# Patient Record
Sex: Male | Born: 1980 | Hispanic: Yes | Marital: Single | Smoking: Former smoker
Health system: Southern US, Community
[De-identification: ages and names within clinical notes are randomized; demographics above are authoritative.]

## PROBLEM LIST (undated history)

## (undated) DIAGNOSIS — J45909 Unspecified asthma, uncomplicated: Secondary | ICD-10-CM

---

## 2021-01-15 ENCOUNTER — Other Ambulatory Visit: Payer: Self-pay

## 2021-01-15 ENCOUNTER — Ambulatory Visit (INDEPENDENT_AMBULATORY_CARE_PROVIDER_SITE_OTHER): Payer: Self-pay

## 2021-01-15 ENCOUNTER — Ambulatory Visit (HOSPITAL_COMMUNITY)
Admission: EM | Admit: 2021-01-15 | Discharge: 2021-01-15 | Disposition: A | Discharge status: Home / Self Care | Payer: Self-pay | Attending: Emergency Medicine | Admitting: Emergency Medicine

## 2021-01-15 ENCOUNTER — Encounter (HOSPITAL_COMMUNITY): Payer: Self-pay | Admitting: *Deleted

## 2021-01-15 DIAGNOSIS — S39012A Strain of muscle, fascia and tendon of lower back, initial encounter: Secondary | ICD-10-CM

## 2021-01-15 DIAGNOSIS — M545 Low back pain, unspecified: Secondary | ICD-10-CM

## 2021-01-15 DIAGNOSIS — T148XXA Other injury of unspecified body region, initial encounter: Secondary | ICD-10-CM

## 2021-01-15 DIAGNOSIS — M549 Dorsalgia, unspecified: Secondary | ICD-10-CM

## 2021-01-15 DIAGNOSIS — M25512 Pain in left shoulder: Secondary | ICD-10-CM

## 2021-01-15 MED ORDER — METHOCARBAMOL 500 MG PO TABS: 500.0000 mg | ORAL_TABLET | Freq: Two times a day (BID) | ORAL | 0 refills | Status: DC

## 2021-01-15 MED ORDER — NAPROXEN 500 MG PO TABS: 500.0000 mg | ORAL_TABLET | Freq: Two times a day (BID) | ORAL | 0 refills | Status: DC
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
## 2021-01-15 NOTE — Discharge Instructions (Addendum)
Pt will need to follow up at ortho for further tests  Take muscle relaxor as needed for pain, do not drive while taking  Take nsaids as needed for pain  X rays are normal today
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
## 2021-07-13 ENCOUNTER — Encounter (HOSPITAL_COMMUNITY): Payer: Self-pay

## 2021-07-13 ENCOUNTER — Inpatient Hospital Stay (HOSPITAL_COMMUNITY)
Admission: EM | Admit: 2021-07-13 | Discharge: 2021-07-15 | DRG: 202 | Disposition: A | Discharge status: Home / Self Care | Payer: Self-pay | Attending: Family Medicine | Admitting: Family Medicine

## 2021-07-13 ENCOUNTER — Other Ambulatory Visit: Payer: Self-pay

## 2021-07-13 ENCOUNTER — Emergency Department (HOSPITAL_COMMUNITY): Payer: Self-pay

## 2021-07-13 DIAGNOSIS — E669 Obesity, unspecified: Secondary | ICD-10-CM

## 2021-07-13 DIAGNOSIS — Z6836 Body mass index (BMI) 36.0-36.9, adult: Secondary | ICD-10-CM

## 2021-07-13 DIAGNOSIS — Z20822 Contact with and (suspected) exposure to covid-19: Secondary | ICD-10-CM | POA: Diagnosis present

## 2021-07-13 DIAGNOSIS — Z720 Tobacco use: Secondary | ICD-10-CM

## 2021-07-13 DIAGNOSIS — J9601 Acute respiratory failure with hypoxia: Secondary | ICD-10-CM

## 2021-07-13 DIAGNOSIS — D72829 Elevated white blood cell count, unspecified: Secondary | ICD-10-CM

## 2021-07-13 DIAGNOSIS — Z79899 Other long term (current) drug therapy: Secondary | ICD-10-CM

## 2021-07-13 DIAGNOSIS — T380X5A Adverse effect of glucocorticoids and synthetic analogues, initial encounter: Secondary | ICD-10-CM | POA: Diagnosis present

## 2021-07-13 DIAGNOSIS — E876 Hypokalemia: Secondary | ICD-10-CM

## 2021-07-13 DIAGNOSIS — F17210 Nicotine dependence, cigarettes, uncomplicated: Secondary | ICD-10-CM | POA: Diagnosis present

## 2021-07-13 DIAGNOSIS — D751 Secondary polycythemia: Secondary | ICD-10-CM | POA: Diagnosis present

## 2021-07-13 DIAGNOSIS — J209 Acute bronchitis, unspecified: Principal | ICD-10-CM

## 2021-07-13 HISTORY — DX: Unspecified asthma, uncomplicated: J45.909

## 2021-07-13 LAB — RESP PANEL BY RT-PCR (FLU A&B, COVID) ARPGX2
Influenza A by PCR: NEGATIVE
Influenza B by PCR: NEGATIVE
SARS Coronavirus 2 by RT PCR: NEGATIVE

## 2021-07-13 LAB — CBC WITH DIFFERENTIAL/PLATELET
Abs Immature Granulocytes: 0.14 10*3/uL — ABNORMAL HIGH (ref 0.00–0.07)
Basophils Absolute: 0.1 10*3/uL (ref 0.0–0.1)
Basophils Relative: 1 %
Eosinophils Absolute: 0.5 10*3/uL (ref 0.0–0.5)
Eosinophils Relative: 4 %
HCT: 51.2 % (ref 39.0–52.0)
Hemoglobin: 17.6 g/dL — ABNORMAL HIGH (ref 13.0–17.0)
Immature Granulocytes: 1 %
Lymphocytes Relative: 20 %
Lymphs Abs: 2.6 10*3/uL (ref 0.7–4.0)
MCH: 29 pg (ref 26.0–34.0)
MCHC: 34.4 g/dL (ref 30.0–36.0)
MCV: 84.3 fL (ref 80.0–100.0)
Monocytes Absolute: 1.1 10*3/uL — ABNORMAL HIGH (ref 0.1–1.0)
Monocytes Relative: 8 %
Neutro Abs: 8.5 10*3/uL — ABNORMAL HIGH (ref 1.7–7.7)
Neutrophils Relative %: 66 %
Platelets: 249 10*3/uL (ref 150–400)
RBC: 6.07 MIL/uL — ABNORMAL HIGH (ref 4.22–5.81)
RDW: 13.1 % (ref 11.5–15.5)
WBC: 12.9 10*3/uL — ABNORMAL HIGH (ref 4.0–10.5)
nRBC: 0 % (ref 0.0–0.2)

## 2021-07-13 LAB — COMPREHENSIVE METABOLIC PANEL
ALT: 33 U/L (ref 0–44)
AST: 44 U/L — ABNORMAL HIGH (ref 15–41)
Albumin: 4.3 g/dL (ref 3.5–5.0)
Alkaline Phosphatase: 70 U/L (ref 38–126)
Anion gap: 8 (ref 5–15)
BUN: 10 mg/dL (ref 6–20)
CO2: 22 mmol/L (ref 22–32)
Calcium: 9 mg/dL (ref 8.9–10.3)
Chloride: 105 mmol/L (ref 98–111)
Creatinine, Ser: 0.9 mg/dL (ref 0.61–1.24)
GFR, Estimated: >60 mL/min (ref >60)
Glucose, Bld: 106 mg/dL — ABNORMAL HIGH (ref 70–99)
Potassium: 3.4 mmol/L — ABNORMAL LOW (ref 3.5–5.1)
Sodium: 135 mmol/L (ref 135–145)
Total Bilirubin: 0.6 mg/dL (ref 0.3–1.2)
Total Protein: 7.8 g/dL (ref 6.5–8.1)

## 2021-07-13 MED ORDER — ALBUTEROL SULFATE HFA 108 (90 BASE) MCG/ACT IN AERS: 2.0000 | INHALATION_SPRAY | RESPIRATORY_TRACT | Status: DC | PRN

## 2021-07-13 MED ORDER — MAGNESIUM SULFATE 2 GM/50ML IV SOLN
2.0000 g | Freq: Once | INTRAVENOUS | Status: AC
  Administered 2021-07-13: 2 g via INTRAVENOUS
  Filled 2021-07-13: qty 50

## 2021-07-13 MED ORDER — SODIUM CHLORIDE 0.9 % IV BOLUS
1000.0000 mL | Freq: Once | INTRAVENOUS | Status: AC
  Administered 2021-07-13: 1000 mL via INTRAVENOUS

## 2021-07-13 MED ORDER — IPRATROPIUM-ALBUTEROL 0.5-2.5 (3) MG/3ML IN SOLN
3.0000 mL | Freq: Once | RESPIRATORY_TRACT | Status: AC
  Administered 2021-07-13: 3 mL via RESPIRATORY_TRACT
  Filled 2021-07-13: qty 3

## 2021-07-13 MED ORDER — POTASSIUM CHLORIDE CRYS ER 20 MEQ PO TBCR
40.0000 meq | EXTENDED_RELEASE_TABLET | Freq: Once | ORAL | Status: AC
  Administered 2021-07-13: 40 meq via ORAL
  Filled 2021-07-13: qty 2

## 2021-07-13 MED ORDER — ALBUTEROL SULFATE (2.5 MG/3ML) 0.083% IN NEBU
INHALATION_SOLUTION | RESPIRATORY_TRACT | Status: AC
  Administered 2021-07-13: 15 mg
  Filled 2021-07-13: qty 18

## 2021-07-13 MED ORDER — METHYLPREDNISOLONE SODIUM SUCC 125 MG IJ SOLR
125.0000 mg | Freq: Once | INTRAMUSCULAR | Status: AC
  Administered 2021-07-13: 125 mg via INTRAVENOUS
  Filled 2021-07-13: qty 2

## 2021-07-13 MED ORDER — IPRATROPIUM BROMIDE 0.02 % IN SOLN
RESPIRATORY_TRACT | Status: AC
  Administered 2021-07-13: 1 mg
  Filled 2021-07-13: qty 5

## 2021-07-13 MED ORDER — ALBUTEROL SULFATE (2.5 MG/3ML) 0.083% IN NEBU
2.5000 mg | INHALATION_SOLUTION | Freq: Once | RESPIRATORY_TRACT | Status: AC
  Administered 2021-07-13: 2.5 mg via RESPIRATORY_TRACT
  Filled 2021-07-13: qty 3

## 2021-07-13 NOTE — ED Triage Notes (Signed)
Pt c/o SOB x3 days with some nonproductive cough. Hx of asthma as a child.  ?

## 2021-07-13 NOTE — H&P (Signed)
?History and Physical  ? ? ?Patient: Randy Newman. SPQ:[id number] DOB: 1980/12/02 ?DOA: 07/13/2021 ?DOS: the patient was seen and examined on 07/14/2021 ?PCP: Pcp, No  ?Patient coming from: Home ? ?Chief Complaint:  ?Chief Complaint  ?Patient presents with  ? Shortness of Breath  ? ?HPI: Randy Newman. is a 41 y.o. male with medical history significant of asthma (as a child) and tobacco abuse who presents emergency department due to 3-day onset of increasing shortness of breath, chest tightness and nonproductive cough associated with intermittent headache.  Patient denies sick contact, fever, chills, nasal congestion, nausea, vomiting, abdominal pain, chest pain. ? ?ED Course:  ?In the emergency department, he was tachypneic and intermittently tachycardic, BP was 141/75, patient was intermittently hypoxic on room air and was provided with supplemental oxygen via [place] at 2 LPM with improvement in oxygenation.  Work-up in the ED showed leukocytosis, hypokalemia.  Influenza A, B, SARS coronavirus 2 was negative. ?Chest x-ray showed no active disease ?Patient was treated with IV Solu-Medrol 125 mg x 1, breathing treatment was provided, magnesium was given IV hydration was provided and potassium was replenished.  Hospitalist was asked to admit patient for further evaluation and management. ? ?Review of Systems: ?Review of systems as noted in the HPI. All other systems reviewed and are negative. ? ? ?Past Medical History:  ?Diagnosis Date  ? Asthma   ? ?History reviewed. No pertinent surgical history. ? ?Social History:  reports that he has been smoking cigarettes. He has a 3.00 pack-year smoking history. He has never used smokeless tobacco. He reports that he does not currently use alcohol. He reports that he does not currently use drugs. ? ? ?No Known Allergies ? ?Family history: ?Patient denies any significant family history ? ? ?Prior to Admission medications   ?Medication Sig Start Date End Date  Taking? Authorizing Provider  ?methocarbamol (ROBAXIN) 500 MG tablet Take 1 tablet (500 mg total) by mouth 2 (two) times daily. 01/15/21   Coralyn Mark, NP  ?naproxen (NAPROSYN) 500 MG tablet Take 1 tablet (500 mg total) by mouth 2 (two) times daily. 01/15/21   Coralyn Mark, NP  ? ? ?Physical Exam: ?BP 119/68   Pulse (!) 108   Temp 98.2 ?F (36.8 ?C) (Oral)   Resp (!) 21   Ht 6' (1.829 m)   Wt 120.2 kg   SpO2 (!) 89%   BMI 35.94 kg/m?  ? ?General: 41 y.o. year-old obese male well developed well nourished in no acute distress.  Alert and oriented x3. ?HEENT: NCAT, EOMI ?Neck: Supple, trachea medial ?Cardiovascular: Regular rate and rhythm with no rubs or gallops.  No thyromegaly or JVD noted.  No lower extremity edema. 2/4 pulses in all 4 extremities. ?Respiratory: Tachypnea, diffuse expiratory wheezing.   ?Abdomen: Soft, nontender nondistended with normal bowel sounds x4 quadrants. ?Muskuloskeletal: No cyanosis, clubbing or edema noted bilaterally ?Neuro: CN II-XII intact, strength 5/5 x 4, sensation, reflexes intact ?Skin: No ulcerative lesions noted or rashes ?Psychiatry: Judgement and insight appear normal. Mood is appropriate for condition and setting ?   ?   ?   ?Labs on Admission:  ?Basic Metabolic Panel: ?Recent Labs  ?Lab 07/13/21 ?1929  ?NA 135  ?K 3.4*  ?CL 105  ?CO2 22  ?GLUCOSE 106*  ?BUN 10  ?CREATININE 0.90  ?CALCIUM 9.0  ? ?Liver Function Tests: ?Recent Labs  ?Lab 07/13/21 ?1929  ?AST 44*  ?ALT 33  ?ALKPHOS 70  ?BILITOT 0.6  ?PROT 7.8  ?  ALBUMIN 4.3  ? ?No results for input(s): LIPASE, AMYLASE in the last 168 hours. ?No results for input(s): AMMONIA in the last 168 hours. ?CBC: ?Recent Labs  ?Lab 07/13/21 ?1929  ?WBC 12.9*  ?NEUTROABS 8.5*  ?HGB 17.6*  ?HCT 51.2  ?MCV 84.3  ?PLT 249  ? ?Cardiac Enzymes: ?No results for input(s): CKTOTAL, CKMB, CKMBINDEX, TROPONINI in the last 168 hours. ? ?BNP (last 3 results) ?No results for input(s): BNP in the last 8760 hours. ? ?ProBNP (last 3  results) ?No results for input(s): PROBNP in the last 8760 hours. ? ?CBG: ?No results for input(s): GLUCAP in the last 168 hours. ? ?Radiological Exams on Admission: ?DG Chest Port 1 View ? ?Result Date: 07/13/2021 ?CLINICAL DATA:  Shortness of breath and cough for several days EXAM: PORTABLE CHEST 1 VIEW COMPARISON:  None. FINDINGS: The heart size and mediastinal contours are within normal limits. Both lungs are clear. The visualized skeletal structures are unremarkable. IMPRESSION: No active disease. Electronically Signed   By: Alcide Clever M.D.   On: 07/13/2021 19:42   ? ?EKG: I independently viewed the EKG done and my findings are as followed: Normal sinus rhythm at a rate of 87 bpm, no acute ST/T wave changes ? ?Assessment/Plan ?Present on Admission: ? Acute respiratory failure with hypoxia (HCC) ? ?Principal Problem: ?  Acute respiratory failure with hypoxia (HCC) ?Active Problems: ?  Acute bronchitis ?  Hypokalemia ?  Leukocytosis ?  Tobacco abuse ?  Obesity (BMI 30-39.9) ? ?Acute respiratory failure with hypoxia possibly secondary to acute bronchitis ?History of asthma (as a child), patient states that he was never on any breathing treatment regimen for this ?Continue duo nebs, Mucinex, Robitussin, Solu-Medrol, azithromycin. ?Continue Protonix to prevent steroid-induced ulcer ?Continue incentive spirometry and flutter valve ?Continue supplemental oxygen to maintain O2 sat > 92% with plan to wean patient off oxygen as tolerated (patient does not use any supplemental oxygen at baseline). ? ?Hypokalemia ?K+ is 3.4 ?K+ will be replenished ?Please monitor for AM K+ for further replenishmemnt ? ?Leukocytosis possibly secondary to reactive process ?WBC 12.9, continue to monitor WBC with morning labs ? ?Tobacco abuse ?Patient was counseled on tobacco abuse cessation ? ?Obesity (BMI 35.94 kg/m?) ?Patient was counseled on diet and lifestyle modification ? ?DVT prophylaxis: Lovenox ? ? Advance Care Planning:   Code  Status: Full Code  ? ?Consults: None ? ?Family Communication: None at bedside ? ?Severity of Illness: ?The appropriate patient status for this patient is OBSERVATION. Observation status is judged to be reasonable and necessary in order to provide the required intensity of service to ensure the patient's safety. The patient's presenting symptoms, physical exam findings, and initial radiographic and laboratory data in the context of their medical condition is felt to place them at decreased risk for further clinical deterioration. Furthermore, it is anticipated that the patient will be medically stable for discharge from the hospital within 2 midnights of admission.  ? ?Author: Frankey Shown, DO ?07/14/2021 1:26 AM ? ?For on call review [email or web address].  ?

## 2021-07-13 NOTE — ED Notes (Signed)
Started on continuous neb 15/1.0  mgs --albuterol / atrovent - patient smokes has asthma.  ?

## 2021-07-13 NOTE — ED Provider Notes (Signed)
?[facility] ?Provider Note ? ? ?CSN: [id number] ?Arrival date & time: 07/13/21  1854 ? ?  ? ?History ? ?Chief Complaint  ?Patient presents with  ? Shortness of Breath  ? ? ?Randy Newman. is a 41 y.o. male. ? ?HPI ?41 year old male with a history of asthma as a child and a current pack per day smoker presents with cough and shortness of breath.  Is having some chest tightness.  All of the symptoms started about 3 days ago.  No fevers but he had on and off headache when he is coughing as well as a little bit of diarrhea.  No vomiting.  He is about halfway through a continuous albuterol treatment that was started prior to me seeing him and states that he is feeling better. ? ?Home Medications ?Prior to Admission medications   ?Medication Sig Start Date End Date Taking? Authorizing Provider  ?methocarbamol (ROBAXIN) 500 MG tablet Take 1 tablet (500 mg total) by mouth 2 (two) times daily. 01/15/21   Marney Setting, NP  ?naproxen (NAPROSYN) 500 MG tablet Take 1 tablet (500 mg total) by mouth 2 (two) times daily. 01/15/21   Marney Setting, NP  ?   ? ?Allergies    ?Patient has no known allergies.   ? ?Review of Systems   ?Review of Systems  ?Constitutional:  Negative for fever.  ?Respiratory:  Positive for cough, chest tightness, shortness of breath and wheezing.   ?Cardiovascular:  Negative for leg swelling.  ?Gastrointestinal:  Positive for diarrhea. Negative for abdominal pain and vomiting.  ? ?Physical Exam ?Updated Vital Signs ?BP 119/68   Pulse (!) 108   Temp 98.2 ?F (36.8 ?C) (Oral)   Resp (!) 21   Ht 6' (1.829 m)   Wt 120.2 kg   SpO2 (!) 89%   BMI 35.94 kg/m?  ?Physical Exam ?Vitals and nursing note reviewed.  ?Constitutional:   ?   Appearance: He is well-developed. He is obese.  ?HENT:  ?   Head: Normocephalic and atraumatic.  ?Cardiovascular:  ?   Rate and Rhythm: Normal rate and regular rhythm.  ?   Heart sounds: Normal heart sounds.  ?Pulmonary:  ?   Effort:  Pulmonary effort is normal. No tachypnea or accessory muscle usage.  ?   Breath sounds: Wheezing (slight) present.  ?Abdominal:  ?   Palpations: Abdomen is soft.  ?   Tenderness: There is no abdominal tenderness.  ?Skin: ?   General: Skin is warm and dry.  ?Neurological:  ?   Mental Status: He is alert.  ? ? ?ED Results / Procedures / Treatments   ?Labs ?(all labs ordered are listed, but only abnormal results are displayed) ?Labs Reviewed  ?COMPREHENSIVE METABOLIC PANEL - Abnormal; Notable for the following components:  ?    Result Value  ? Potassium 3.4 (*)   ? Glucose, Bld 106 (*)   ? AST 44 (*)   ? All other components within normal limits  ?CBC WITH DIFFERENTIAL/PLATELET - Abnormal; Notable for the following components:  ? WBC 12.9 (*)   ? RBC 6.07 (*)   ? Hemoglobin 17.6 (*)   ? Neutro Abs 8.5 (*)   ? Monocytes Absolute 1.1 (*)   ? Abs Immature Granulocytes 0.14 (*)   ? All other components within normal limits  ?RESP PANEL BY RT-PCR (FLU A&B, COVID) ARPGX2  ? ? ?EKG ?EKG Interpretation ? ?Date/Time:  Sunday July 13 2021 19:08:42 EDT ?Ventricular Rate:  87 ?PR  Interval:  169 ?QRS Duration: 91 ?QT Interval:  362 ?QTC Calculation: 436 ?R Axis:   127 ?Text Interpretation: Sinus rhythm Right axis deviation Baseline wander in lead(s) III aVL aVF no acute ST/T changes No old tracing to compare Confirmed by Sherwood Gambler [phone]) on 07/13/2021 8:50:35 PM ? ?Radiology ?DG Chest Port 1 View ? ?Result Date: 07/13/2021 ?CLINICAL DATA:  Shortness of breath and cough for several days EXAM: PORTABLE CHEST 1 VIEW COMPARISON:  None. FINDINGS: The heart size and mediastinal contours are within normal limits. Both lungs are clear. The visualized skeletal structures are unremarkable. IMPRESSION: No active disease. Electronically Signed   By: Inez Catalina M.D.   On: 07/13/2021 19:42   ? ?Procedures ?Marland KitchenCritical Care ?Performed by: Sherwood Gambler, MD ?Authorized by: Sherwood Gambler, MD  ? ?Critical care provider statement:  ?   Critical care time (minutes):  35 ?  Critical care time was exclusive of:  Separately billable procedures and treating other patients ?  Critical care was necessary to treat or prevent imminent or life-threatening deterioration of the following conditions:  Respiratory failure ?  Critical care was time spent personally by me on the following activities:  Development of treatment plan with patient or surrogate, discussions with consultants, evaluation of patient's response to treatment, examination of patient, ordering and review of laboratory studies, ordering and review of radiographic studies, ordering and performing treatments and interventions, pulse oximetry, re-evaluation of patient's condition and review of old charts  ? ? ?Medications Ordered in ED ?Medications  ?albuterol (VENTOLIN HFA) 108 (90 Base) MCG/ACT inhaler 2 puff (has no administration in time range)  ?albuterol (VENTOLIN HFA) 108 (90 Base) MCG/ACT inhaler 2 puff (has no administration in time range)  ?ipratropium (ATROVENT) 0.02 % nebulizer solution (1 mg  Given 07/13/21 1921)  ?albuterol (PROVENTIL) (2.5 MG/3ML) 0.083% nebulizer solution (15 mg  Given 07/13/21 1921)  ?sodium chloride 0.9 % bolus 1,000 mL (0 mLs Intravenous Stopped 07/13/21 2239)  ?potassium chloride SA (KLOR-CON M) CR tablet 40 mEq (40 mEq Oral Given 07/13/21 2057)  ?magnesium sulfate IVPB 2 g 50 mL (0 g Intravenous Stopped 07/13/21 2303)  ?methylPREDNISolone sodium succinate (SOLU-MEDROL) 125 mg/2 mL injection 125 mg (125 mg Intravenous Given 07/13/21 2159)  ?ipratropium-albuterol (DUONEB) 0.5-2.5 (3) MG/3ML nebulizer solution 3 mL (3 mLs Nebulization Given 07/13/21 2144)  ?albuterol (PROVENTIL) (2.5 MG/3ML) 0.083% nebulizer solution 2.5 mg (2.5 mg Nebulization Given 07/13/21 2145)  ? ? ?ED Course/ Medical Decision Making/ A&P ?  ?                        ?Medical Decision Making ?Amount and/or Complexity of Data Reviewed ?Labs: ordered. ? ?Risk ?Prescription drug management. ?Decision  regarding hospitalization. ? ? ?Patient presents with shortness of breath and wheezing.  He does feel better after being given the initial hour-long treatment started by respiratory therapy.  However after this he developed a mild hypoxia requiring 2 L of oxygen.  I went back and reassessed and he still has a little bit of wheezing and so now he was opened up to give Solu-Medrol, albuterol, magnesium.  He does not have a known diagnosis of COPD but he did have asthma as a kid and is a pack per day smoker.  At this point, my suspicion that he has ACS, PE, or bacterial pneumonia is fairly low.  I suspect this is still bronchospasm and at least bronchitis.  He may have a history of reactive airway disease that  will need to be worked up as an outpatient.  However at this point he still requiring 2 L and while he is feeling better I think you will need admission.  Discussed with Dr. Josephine Cables. ? ? ? ? ? ? ? ?Final Clinical Impression(s) / ED Diagnoses ?Final diagnoses:  ?Acute bronchitis, unspecified organism  ?Acute respiratory failure with hypoxia ([place])  ? ? ?Rx / DC Orders ?ED Discharge Orders   ? ? None  ? ?  ? ? ?  ?Sherwood Gambler, MD ?07/13/21 2328 ? ?

## 2021-07-13 NOTE — ED Notes (Signed)
Placed on 2lpm via [place] to keep sats 90% ?

## 2021-07-14 DIAGNOSIS — E66812 Obesity, class 2: Secondary | ICD-10-CM

## 2021-07-14 DIAGNOSIS — Z720 Tobacco use: Secondary | ICD-10-CM

## 2021-07-14 DIAGNOSIS — D72829 Elevated white blood cell count, unspecified: Secondary | ICD-10-CM

## 2021-07-14 DIAGNOSIS — E876 Hypokalemia: Secondary | ICD-10-CM

## 2021-07-14 DIAGNOSIS — J209 Acute bronchitis, unspecified: Secondary | ICD-10-CM

## 2021-07-14 DIAGNOSIS — E669 Obesity, unspecified: Secondary | ICD-10-CM

## 2021-07-14 LAB — RESPIRATORY PANEL BY PCR

## 2021-07-14 LAB — COMPREHENSIVE METABOLIC PANEL
ALT: 33 U/L (ref 0–44)
AST: 42 U/L — ABNORMAL HIGH (ref 15–41)
Albumin: 4.1 g/dL (ref 3.5–5.0)
Alkaline Phosphatase: 66 U/L (ref 38–126)
Anion gap: 8 (ref 5–15)
BUN: 7 mg/dL (ref 6–20)
CO2: 24 mmol/L (ref 22–32)
Calcium: 8.7 mg/dL — ABNORMAL LOW (ref 8.9–10.3)
Chloride: 106 mmol/L (ref 98–111)
Creatinine, Ser: 0.96 mg/dL (ref 0.61–1.24)
GFR, Estimated: >60 mL/min (ref >60)
Glucose, Bld: 175 mg/dL — ABNORMAL HIGH (ref 70–99)
Potassium: 4 mmol/L (ref 3.5–5.1)
Sodium: 138 mmol/L (ref 135–145)
Total Bilirubin: 0.5 mg/dL (ref 0.3–1.2)
Total Protein: 7.5 g/dL (ref 6.5–8.1)

## 2021-07-14 LAB — CBC
HCT: 51 % (ref 39.0–52.0)
Hemoglobin: 17 g/dL (ref 13.0–17.0)
MCH: 28 pg (ref 26.0–34.0)
MCHC: 33.3 g/dL (ref 30.0–36.0)
MCV: 83.9 fL (ref 80.0–100.0)
Platelets: 227 10*3/uL (ref 150–400)
RBC: 6.08 MIL/uL — ABNORMAL HIGH (ref 4.22–5.81)
RDW: 13 % (ref 11.5–15.5)
WBC: 9.9 10*3/uL (ref 4.0–10.5)
nRBC: 0 % (ref 0.0–0.2)

## 2021-07-14 LAB — HEMOGLOBIN A1C
Hgb A1c MFr Bld: 5.3 % (ref 4.8–5.6)
Mean Plasma Glucose: 105.41 mg/dL

## 2021-07-14 LAB — PHOSPHORUS: Phosphorus: 1.3 mg/dL — ABNORMAL LOW (ref 2.5–4.6)

## 2021-07-14 LAB — HIV ANTIBODY (ROUTINE TESTING W REFLEX): HIV Screen 4th Generation wRfx: NONREACTIVE

## 2021-07-14 LAB — APTT: aPTT: 26 seconds (ref 24–36)

## 2021-07-14 LAB — MAGNESIUM: Magnesium: 2.2 mg/dL (ref 1.7–2.4)

## 2021-07-14 MED ORDER — DM-GUAIFENESIN ER 30-600 MG PO TB12
1.0000 | ORAL_TABLET | Freq: Two times a day (BID) | ORAL | Status: DC
  Administered 2021-07-14 – 2021-07-15 (×3): 1 via ORAL
  Filled 2021-07-14 (×3): qty 1

## 2021-07-14 MED ORDER — PANTOPRAZOLE SODIUM 40 MG PO TBEC
40.0000 mg | DELAYED_RELEASE_TABLET | Freq: Every day | ORAL | Status: DC
Start: 2021-07-14 — End: 2021-07-15
  Administered 2021-07-14 – 2021-07-15 (×2): 40 mg via ORAL
  Filled 2021-07-14 (×2): qty 1

## 2021-07-14 MED ORDER — ALBUTEROL SULFATE (2.5 MG/3ML) 0.083% IN NEBU: 2.5000 mg | INHALATION_SOLUTION | RESPIRATORY_TRACT | Status: DC | PRN

## 2021-07-14 MED ORDER — ACETAMINOPHEN 325 MG PO TABS: 650.0000 mg | ORAL_TABLET | Freq: Four times a day (QID) | ORAL | Status: DC | PRN

## 2021-07-14 MED ORDER — GUAIFENESIN-DM 100-10 MG/5ML PO SYRP
5.0000 mL | ORAL_SOLUTION | ORAL | Status: DC | PRN
Start: 2021-07-14 — End: 2021-07-15

## 2021-07-14 MED ORDER — POTASSIUM & SODIUM PHOSPHATES 280-160-250 MG PO PACK
1.0000 | PACK | Freq: Three times a day (TID) | ORAL | Status: DC
  Administered 2021-07-14 (×3): 1 via ORAL
  Filled 2021-07-14 (×2): qty 1
  Filled 2021-07-14: qty 2

## 2021-07-14 MED ORDER — IPRATROPIUM-ALBUTEROL 0.5-2.5 (3) MG/3ML IN SOLN
3.0000 mL | Freq: Three times a day (TID) | RESPIRATORY_TRACT | Status: DC
Start: 2021-07-14 — End: 2021-07-15
  Administered 2021-07-14 – 2021-07-15 (×2): 3 mL via RESPIRATORY_TRACT
  Filled 2021-07-14 (×2): qty 3

## 2021-07-14 MED ORDER — IPRATROPIUM-ALBUTEROL 0.5-2.5 (3) MG/3ML IN SOLN
3.0000 mL | Freq: Four times a day (QID) | RESPIRATORY_TRACT | Status: DC
Start: 2021-07-14 — End: 2021-07-14
  Administered 2021-07-14 (×2): 3 mL via RESPIRATORY_TRACT
  Filled 2021-07-14 (×3): qty 3

## 2021-07-14 MED ORDER — AZITHROMYCIN 250 MG PO TABS
250.0000 mg | ORAL_TABLET | Freq: Every day | ORAL | Status: DC
  Administered 2021-07-15: 250 mg via ORAL
  Filled 2021-07-14: qty 1

## 2021-07-14 MED ORDER — AZITHROMYCIN 250 MG PO TABS
500.0000 mg | ORAL_TABLET | Freq: Every day | ORAL | Status: AC
  Administered 2021-07-14: 500 mg via ORAL
  Filled 2021-07-14: qty 2

## 2021-07-14 MED ORDER — ACETAMINOPHEN 650 MG RE SUPP: 650.0000 mg | Freq: Four times a day (QID) | RECTAL | Status: DC | PRN

## 2021-07-14 MED ORDER — METHYLPREDNISOLONE SODIUM SUCC 40 MG IJ SOLR
40.0000 mg | Freq: Two times a day (BID) | INTRAMUSCULAR | Status: DC
Start: 2021-07-14 — End: 2021-07-15
  Administered 2021-07-14 – 2021-07-15 (×3): 40 mg via INTRAVENOUS
  Filled 2021-07-14 (×3): qty 1

## 2021-07-14 MED ORDER — ENOXAPARIN SODIUM 60 MG/0.6ML IJ SOSY
60.0000 mg | PREFILLED_SYRINGE | INTRAMUSCULAR | Status: DC
  Administered 2021-07-14: 60 mg via SUBCUTANEOUS
  Filled 2021-07-14 (×2): qty 0.6

## 2021-07-14 NOTE — Progress Notes (Signed)
Report given to [facility], pt taken to 3rd floor at this time. Nad.  ?

## 2021-07-14 NOTE — TOC Progression Note (Signed)
Transition of Care (TOC) - Progression Note  ? ? ?Patient Details  ?Name: Randy Newman. ?MRN: [id number] ?Date of Birth: 03-Jun-1980 ? ?Transition of Care (TOC) CM/SW Contact  ?Salome Arnt, LCSW ?Phone Number: ?07/14/2021, 8:08 AM ? ?Clinical Narrative:  LCSW noted pt does not have insurance or PCP. Discussed referral to [facility] with pt and he is agreeable. Referral made. Pt reports he has transportation. No other needs reported at this time. TOC will continue to follow.   ? ? ? ?  ?  ? ?Expected Discharge Plan and Services ?  ?  ?  ?  ?  ?                ?  ?  ?  ?  ?  ?  ?  ?  ?  ?  ? ? ?Social Determinants of Health (SDOH) Interventions ?  ? ?Readmission Risk Interventions ?   ? View : No data to display.  ?  ?  ?  ? ? ?

## 2021-07-14 NOTE — Progress Notes (Signed)
?Progress Note ? ?Patient: Randy Krasngel Garcia Liles Jr. BJY:[id number] DOB: [birth year]  ?DOA: 07/13/2021  DOS: 07/14/2021  ?  ?Brief hospital course: ?Randy Dachngel Garcia Renold DonSaldierna Jr. is a 41 y.o. male with a history of tobacco use, childhood asthma who presented to the ED 3/26 with worsening shortness of breath, nonproductive cough, chest tightness. He was found to be hypoxic, tachypneic and tachycardic with no infiltrate on CXR. He was given IV steroids, breathing treatments, azithromycin, and subsequently admitted for acute bronchitis.  ? ?Assessment and Plan: ?Acute hypoxic respiratory failure due to acute bronchitis: No bacterial consolidation on CXR, negative flu/covid PCRs. Remains hypoxic and tight.  ?- Check full respiratory viral panel ?- Continue empiric azithromycin ?- Continue IV steroids while still tight ?- Wean oxygen as tolerated.  ?- Pulmonary hygiene.  ?- History of childhood asthma. No medical follow up. Would benefit from formal PFTs after acute illness. TOC consulted.  ? ?Tobacco use: With secondary polycythemia noted.  ?- Cessation counseling provided, pt is ready to enact change. PCP follow up will be helpful for this.  ? ?Hypokalemia: Resolved ? ?Leukocytosis: Reactive, resolved.  ? ?Steroid-induced hyperglycemia:  ?- Check HbA1c ? ?Obesity: Estimated body mass index is 36.54 kg/m? as calculated from the following: ?  Height as of this encounter: 6' (1.829 m). ?  Weight as of this encounter: 122.2 kg. ? ?Subjective: Shortness of breath at rest is slightly improved, but winded even with getting up to bathroom (has no dyspnea with exertion at baseline). No chest pain. Cough is stable, productive of minimal white stringy phlegm. ? ?Objective: ?Vitals:  ? 07/14/21 0238 07/14/21 0500 07/14/21 0809 07/14/21 1300  ?BP: (!) 134/94 (!) 138/92  (!) 159/86  ?Pulse: 90 86  78  ?Resp:    20  ?Temp: 98.1 ?F (36.7 ?C) 98.3 ?F (36.8 ?C)  98.6 ?F (37 ?C)  ?TempSrc: Oral Oral  Oral  ?SpO2: 94% 94% 96% 93%  ?Weight:  122.2 kg     ?Height: 6' (1.829 m)     ? ?Gen: Obese, pleasant 41 y.o. male in no distress ?Pulm: Nonlabored with supplemental oxygen, diminished with minimal end-expiratory wheeze.  ?CV: Regular rate and rhythm. No murmur, rub, or gallop. No JVD, no dependent edema. ?GI: Abdomen soft, non-tender, non-distended, with normoactive bowel sounds.  ?Ext: Warm, no deformities ?Skin: No rashes, lesions or ulcers on visualized skin. ?Neuro: Alert and oriented. No focal neurological deficits. ?Psych: Judgement and insight appear fair. Mood euthymic & affect congruent. Behavior is appropriate.   ? ?Data Personally reviewed: ? ?CBC: ?Recent Labs  ?Lab 07/13/21 ?1929 07/14/21 ?0418  ?WBC 12.9* 9.9  ?NEUTROABS 8.5*  --   ?HGB 17.6* 17.0  ?HCT 51.2 51.0  ?MCV 84.3 83.9  ?PLT 249 227  ? ?Basic Metabolic Panel: ?Recent Labs  ?Lab 07/13/21 ?1929 07/14/21 ?0418  ?NA 135 138  ?K 3.4* 4.0  ?CL 105 106  ?CO2 22 24  ?GLUCOSE 106* 175*  ?BUN 10 7  ?CREATININE 0.90 0.96  ?CALCIUM 9.0 8.7*  ?MG  --  2.2  ?PHOS  --  1.3*  ? ?GFR: ?Estimated Creatinine Clearance: 139.4 mL/min (by C-G formula based on SCr of 0.96 mg/dL). ?Liver Function Tests: ?Recent Labs  ?Lab 07/13/21 ?1929 07/14/21 ?0418  ?AST 44* 42*  ?ALT 33 33  ?ALKPHOS 70 66  ?BILITOT 0.6 0.5  ?PROT 7.8 7.5  ?ALBUMIN 4.3 4.1  ? ?No results for input(s): LIPASE, AMYLASE in the last 168 hours. ?No results for input(s): AMMONIA in the last 168  hours. ?Coagulation Profile: ?No results for input(s): INR, PROTIME in the last 168 hours. ?Cardiac Enzymes: ?No results for input(s): CKTOTAL, CKMB, CKMBINDEX, TROPONINI in the last 168 hours. ?BNP (last 3 results) ?No results for input(s): PROBNP in the last 8760 hours. ?HbA1C: ?No results for input(s): HGBA1C in the last 72 hours. ?CBG: ?No results for input(s): GLUCAP in the last 168 hours. ?Lipid Profile: ?No results for input(s): CHOL, HDL, LDLCALC, TRIG, CHOLHDL, LDLDIRECT in the last 72 hours. ?Thyroid Function Tests: ?No results for  input(s): TSH, T4TOTAL, FREET4, T3FREE, THYROIDAB in the last 72 hours. ?Anemia Panel: ?No results for input(s): VITAMINB12, FOLATE, FERRITIN, TIBC, IRON, RETICCTPCT in the last 72 hours. ?Urine analysis: ?No results found for: COLORURINE, APPEARANCEUR, LABSPEC, PHURINE, GLUCOSEU, HGBUR, BILIRUBINUR, KETONESUR, PROTEINUR, UROBILINOGEN, NITRITE, LEUKOCYTESUR ?Recent Results (from the past 240 hour(s))  ?Resp Panel by RT-PCR (Flu A&B, Covid) Nasopharyngeal Swab     Status: None  ? Collection Time: 07/13/21  7:56 PM  ? Specimen: Nasopharyngeal Swab; Nasopharyngeal(NP) swabs in vial transport medium  ?Result Value Ref Range Status  ? SARS Coronavirus 2 by RT PCR NEGATIVE NEGATIVE Final  ?  Comment: (NOTE) ?SARS-CoV-2 target nucleic acids are NOT DETECTED. ? ?The SARS-CoV-2 RNA is generally detectable in upper respiratory ?specimens during the acute phase of infection. The lowest ?concentration of SARS-CoV-2 viral copies this assay can detect is ?138 copies/mL. A negative result does not preclude SARS-Cov-2 ?infection and should not be used as the sole basis for treatment or ?other patient management decisions. A negative result may occur with  ?improper specimen collection/handling, submission of specimen other ?than nasopharyngeal swab, presence of viral mutation(s) within the ?areas targeted by this assay, and inadequate number of viral ?copies(<138 copies/mL). A negative result must be combined with ?clinical observations, patient history, and epidemiological ?information. The expected result is Negative. ? ?Fact Sheet for Patients:  ?BloggerCourse.com ? ?Fact Sheet for Healthcare Providers:  ?SeriousBroker.it ? ?This test is no t yet approved or cleared by the [place] FDA and  ?has been authorized for detection and/or diagnosis of SARS-CoV-2 by ?FDA under an Emergency Use Authorization (EUA). This EUA will remain  ?in effect (meaning this test can be used) for  the duration of the ?COVID-19 declaration under Section 564(b)(1) of the Act, 21 ?U.S.C.section 360bbb-3(b)(1), unless the authorization is terminated  ?or revoked sooner.  ? ? ?  ? Influenza A by PCR NEGATIVE NEGATIVE Final  ? Influenza B by PCR NEGATIVE NEGATIVE Final  ?  Comment: (NOTE) ?The Xpert Xpress SARS-CoV-2/FLU/RSV plus assay is intended as an aid ?in the diagnosis of influenza from Nasopharyngeal swab specimens and ?should not be used as a sole basis for treatment. Nasal washings and ?aspirates are unacceptable for Xpert Xpress SARS-CoV-2/FLU/RSV ?testing. ? ?Fact Sheet for Patients: ?BloggerCourse.com ? ?Fact Sheet for Healthcare Providers: ?SeriousBroker.it ? ?This test is not yet approved or cleared by the [place] FDA and ?has been authorized for detection and/or diagnosis of SARS-CoV-2 by ?FDA under an Emergency Use Authorization (EUA). This EUA will remain ?in effect (meaning this test can be used) for the duration of the ?COVID-19 declaration under Section 564(b)(1) of the Act, 21 U.S.C. ?section 360bbb-3(b)(1), unless the authorization is terminated or ?revoked. ? ?Performed at [facility], [address], [place], [place] [address] ?  ?   ?DG Chest Port 1 View ? ?Result Date: 07/13/2021 ?CLINICAL DATA:  Shortness of breath and cough for several days EXAM: PORTABLE CHEST 1 VIEW COMPARISON:  None. FINDINGS: The  heart size and mediastinal contours are within normal limits. Both lungs are clear. The visualized skeletal structures are unremarkable. IMPRESSION: No active disease. Electronically Signed   By: Alcide Clever M.D.   On: 07/13/2021 19:42   ? ?SARS-CoV-2 PCR: Negative ?Influenza A/B: Negative ? ?Family Communication: None at bedside ? ?Disposition: ?Status is: Inpatient ?Remains inpatient appropriate because: Continue IV steroids, nebs for acute hypoxic respiratory failure ?Planned Discharge Destination: Home ? ? ? ? ? ?Tyrone Nine,  MD ?07/14/2021 2:25 PM ?Page by [email or web address]  ?

## 2021-07-15 MED ORDER — PREDNISONE 20 MG PO TABS: 40.0000 mg | ORAL_TABLET | Freq: Every day | ORAL | 0 refills | Status: AC

## 2021-07-15 MED ORDER — ALBUTEROL SULFATE (2.5 MG/3ML) 0.083% IN NEBU: 2.5000 mg | INHALATION_SOLUTION | Freq: Four times a day (QID) | RESPIRATORY_TRACT | 1 refills | Status: DC | PRN

## 2021-07-15 MED ORDER — AZITHROMYCIN 250 MG PO TABS
250.0000 mg | ORAL_TABLET | Freq: Every day | ORAL | 0 refills | Status: AC
Start: 2021-07-16 — End: 2021-07-19

## 2021-07-15 NOTE — Progress Notes (Signed)
Patient in need of nebulizer machine. Patient is not insured. Randy Newman with [employer] is able to provide a charity nebulizer for patient. Nebulizer to be delivered to patient's home.  ? ? ?Carreen Milius D, LCSW  ?

## 2021-07-15 NOTE — Progress Notes (Signed)
Discharge instructions reviewed and questions answered by nursing. [facility] brochure given to establish PCP. Patient discharged home via wheelchair with family. ?

## 2021-07-15 NOTE — Discharge Summary (Signed)
?Physician Discharge Summary ?  ?Patient: Randy Newman. MRN: [id number] DOB: 09/27/1980  ?Admit date:     07/13/2021  ?Discharge date: 07/15/21  ?Discharge Physician: Tyrone Nine  ? ?PCP: Pcp, No  ? ?Recommendations at discharge:  ?Establish PCP ASAP. Continue smoking cessation counseling/medication assistance. ? ?Discharge Diagnoses: ?Principal Problem: ?  Acute respiratory failure with hypoxia (HCC) ?Active Problems: ?  Acute bronchitis ?  Hypokalemia ?  Leukocytosis ?  Tobacco abuse ?  Obesity (BMI 30-39.9) ? ?Hospital Course: ?Randy Dickison Yandel Zeiner. is a 41 y.o. male with a history of tobacco use, childhood asthma who presented to the ED 3/26 with worsening shortness of breath, nonproductive cough, chest tightness. He was found to be hypoxic, tachypneic and tachycardic with no infiltrate on CXR. He was given IV steroids, breathing treatments, azithromycin, and subsequently admitted for acute bronchitis. Subsequently the patient's respiratory status improved and he is requesting discharge on 3/28 having been liberated from supplemental oxygen. Dyspnea on exertion is resolved. ? ?Assessment and Plan: ?Acute hypoxic respiratory failure due to acute bronchitis: No bacterial consolidation on CXR, negative flu/covid and full viral panel. ?- Weaned from oxygen, continues steady improvement. ?- Continue empiric azithromycin x5 days ?- Continue steroids x5 days with prednisone after discharge ?- Will get nebulizer at home per TOC and use albuterol RTC x48 hours, then prn. ?- History of childhood asthma. No medical follow up. Would benefit from formal PFTs after acute illness.   ?  ?Tobacco use: With secondary polycythemia noted.  ?- Cessation counseling provided, pt is ready to enact change. PCP follow up will be helpful for this.  ?  ?Hypokalemia: Resolved ?  ?Leukocytosis: Reactive, resolved.  ?  ?Steroid-induced hyperglycemia: HbA1c 5.3%. ?  ?Obesity: Estimated body mass index is 36.54  kg/m?. ? ?Consultants: None ?Procedures performed: None  ?Disposition: Home ?Diet recommendation:  ?Discharge Diet Orders (From admission, onward)  ? ?  Start     Ordered  ? 07/15/21 0000  Diet - low sodium heart healthy       ? 07/15/21 0847  ? ?  ?  ? ?  ? ?Cardiac diet ?DISCHARGE MEDICATION: ?Allergies as of 07/15/2021   ?No Known Allergies ?  ? ?  ?Medication List  ?  ? ?STOP taking these medications   ? ?methocarbamol 500 MG tablet ?Commonly known as: ROBAXIN ?  ?naproxen 500 MG tablet ?Commonly known as: NAPROSYN ?  ? ?  ? ?TAKE these medications   ? ?albuterol (2.5 MG/3ML) 0.083% nebulizer solution ?Commonly known as: PROVENTIL ?Take 3 mLs (2.5 mg total) by nebulization every 6 (six) hours as needed for wheezing or shortness of breath. ?  ?azithromycin 250 MG tablet ?Commonly known as: ZITHROMAX ?Take 1 tablet (250 mg total) by mouth daily for 3 days. ?Start taking on: July 16, 2021 ?  ?predniSONE 20 MG tablet ?Commonly known as: DELTASONE ?Take 2 tablets (40 mg total) by mouth daily for 3 days. ?Start taking on: July 16, 2021 ?  ? ?  ? ?  ?  ? ? ?  ?Durable Medical Equipment  ?(From admission, onward)  ?  ? ? ?  ? ?  Start     Ordered  ? 07/15/21 0842  For home use only DME Nebulizer/meds  Once       ?Question Answer Comment  ?Patient needs a nebulizer to treat with the following condition COPD exacerbation (HCC)   ?Length of Need 6 Months   ?  ? 07/15/21 0843  ? ?  ?  ? ?  ? ?  Follow-up Information   ? ? [facility] Follow up.   ?Why: Contact [facility] to establish primary care physician. ?Contact information: ?[phone] ? ?  ?  ? ?  ?  ? ?  ? ?Discharge Exam: ?BP 136/88 (BP Location: Right Arm)   Pulse 71   Temp 97.7 ?F (36.5 ?C) (Oral)   Resp 20   Ht 6' (1.829 m)   Wt 122.2 kg   SpO2 94%   BMI 36.54 kg/m?   ?Pleasant young male in no distress ?Nonlabored speaking long sentences, Some upper airway sounds that resolve with normal breathing, return with intentionally dep breathing. No true  wheezing. ?RRR without MRG, JVD, or edema ? ?Condition at discharge: stable ? ?The results of significant diagnostics from this hospitalization (including imaging, microbiology, ancillary and laboratory) are listed below for reference.  ? ?Imaging Studies: ?DG Chest Port 1 View ? ?Result Date: 07/13/2021 ?CLINICAL DATA:  Shortness of breath and cough for several days EXAM: PORTABLE CHEST 1 VIEW COMPARISON:  None. FINDINGS: The heart size and mediastinal contours are within normal limits. Both lungs are clear. The visualized skeletal structures are unremarkable. IMPRESSION: No active disease. Electronically Signed   By: Alcide CleverMark  Lukens M.D.   On: 07/13/2021 19:42   ? ?Microbiology: ?Results for orders placed or performed during the hospital encounter of 07/13/21  ?Resp Panel by RT-PCR (Flu A&B, Covid) Nasopharyngeal Swab     Status: None  ? Collection Time: 07/13/21  7:56 PM  ? Specimen: Nasopharyngeal Swab; Nasopharyngeal(NP) swabs in vial transport medium  ?Result Value Ref Range Status  ? SARS Coronavirus 2 by RT PCR NEGATIVE NEGATIVE Final  ?  Comment: (NOTE) ?SARS-CoV-2 target nucleic acids are NOT DETECTED. ? ?The SARS-CoV-2 RNA is generally detectable in upper respiratory ?specimens during the acute phase of infection. The lowest ?concentration of SARS-CoV-2 viral copies this assay can detect is ?138 copies/mL. A negative result does not preclude SARS-Cov-2 ?infection and should not be used as the sole basis for treatment or ?other patient management decisions. A negative result may occur with  ?improper specimen collection/handling, submission of specimen other ?than nasopharyngeal swab, presence of viral mutation(s) within the ?areas targeted by this assay, and inadequate number of viral ?copies(<138 copies/mL). A negative result must be combined with ?clinical observations, patient history, and epidemiological ?information. The expected result is Negative. ? ?Fact Sheet for Patients:   ?BloggerCourse.comhttps://www.fda.gov/media/152166/download ? ?Fact Sheet for Healthcare Providers:  ?SeriousBroker.ithttps://www.fda.gov/media/152162/download ? ?This test is no t yet approved or cleared by the [place] FDA and  ?has been authorized for detection and/or diagnosis of SARS-CoV-2 by ?FDA under an Emergency Use Authorization (EUA). This EUA will remain  ?in effect (meaning this test can be used) for the duration of the ?COVID-19 declaration under Section 564(b)(1) of the Act, 21 ?U.S.C.section 360bbb-3(b)(1), unless the authorization is terminated  ?or revoked sooner.  ? ? ?  ? Influenza A by PCR NEGATIVE NEGATIVE Final  ? Influenza B by PCR NEGATIVE NEGATIVE Final  ?  Comment: (NOTE) ?The Xpert Xpress SARS-CoV-2/FLU/RSV plus assay is intended as an aid ?in the diagnosis of influenza from Nasopharyngeal swab specimens and ?should not be used as a sole basis for treatment. Nasal washings and ?aspirates are unacceptable for Xpert Xpress SARS-CoV-2/FLU/RSV ?testing. ? ?Fact Sheet for Patients: ?BloggerCourse.comhttps://www.fda.gov/media/152166/download ? ?Fact Sheet for Healthcare Providers: ?SeriousBroker.ithttps://www.fda.gov/media/152162/download ? ?This test is not yet approved or cleared by the [place] FDA and ?has been authorized for detection and/or diagnosis of SARS-CoV-2 by ?FDA under  an Emergency Use Authorization (EUA). This EUA will remain ?in effect (meaning this test can be used) for the duration of the ?COVID-19 declaration under Section 564(b)(1) of the Act, 21 U.S.C. ?section 360bbb-3(b)(1), unless the authorization is terminated or ?revoked. ? ?Performed at [facility], [address], [place], [place] [address] ?  ?Respiratory (~20 pathogens) panel by PCR     Status: None  ? Collection Time: 07/14/21  9:43 AM  ? Specimen: Nasopharyngeal Swab; Respiratory  ?Result Value Ref Range Status  ? Adenovirus NOT DETECTED NOT DETECTED Final  ? Coronavirus 229E NOT DETECTED NOT DETECTED Final  ?  Comment: (NOTE) ?The Coronavirus on the Respiratory  Panel, DOES NOT test for the novel  ?Coronavirus (2019 nCoV) ?  ? Coronavirus HKU1 NOT DETECTED NOT DETECTED Final  ? Coronavirus NL63 NOT DETECTED NOT DETECTED Final  ? Coronavirus OC43 NOT DETECTED NOT DETECTED Final  ? Metapneumovirus NOT DETECTED NOT

## 2021-07-15 NOTE — Plan of Care (Signed)
Patient and significant other at bedside educated on discharge instructions and medication management.  ?

## 2021-07-18 ENCOUNTER — Emergency Department (HOSPITAL_COMMUNITY): Payer: 59

## 2021-07-18 ENCOUNTER — Other Ambulatory Visit: Payer: Self-pay

## 2021-07-18 ENCOUNTER — Inpatient Hospital Stay (HOSPITAL_COMMUNITY)
Admission: EM | Admit: 2021-07-18 | Discharge: 2021-07-21 | DRG: 189 | Discharge status: Against Medical Advice | Payer: 59 | Attending: Internal Medicine | Admitting: Internal Medicine

## 2021-07-18 ENCOUNTER — Encounter (HOSPITAL_COMMUNITY): Payer: Self-pay | Admitting: Emergency Medicine

## 2021-07-18 DIAGNOSIS — Z720 Tobacco use: Secondary | ICD-10-CM | POA: Diagnosis present

## 2021-07-18 DIAGNOSIS — F17210 Nicotine dependence, cigarettes, uncomplicated: Secondary | ICD-10-CM | POA: Diagnosis present

## 2021-07-18 DIAGNOSIS — R197 Diarrhea, unspecified: Secondary | ICD-10-CM | POA: Diagnosis present

## 2021-07-18 DIAGNOSIS — D72829 Elevated white blood cell count, unspecified: Secondary | ICD-10-CM | POA: Diagnosis present

## 2021-07-18 DIAGNOSIS — Z6835 Body mass index (BMI) 35.0-35.9, adult: Secondary | ICD-10-CM

## 2021-07-18 DIAGNOSIS — K219 Gastro-esophageal reflux disease without esophagitis: Secondary | ICD-10-CM

## 2021-07-18 DIAGNOSIS — J4521 Mild intermittent asthma with (acute) exacerbation: Secondary | ICD-10-CM | POA: Diagnosis present

## 2021-07-18 DIAGNOSIS — J9601 Acute respiratory failure with hypoxia: Principal | ICD-10-CM | POA: Diagnosis present

## 2021-07-18 DIAGNOSIS — E669 Obesity, unspecified: Secondary | ICD-10-CM | POA: Diagnosis present

## 2021-07-18 DIAGNOSIS — J441 Chronic obstructive pulmonary disease with (acute) exacerbation: Principal | ICD-10-CM

## 2021-07-18 DIAGNOSIS — E66812 Obesity, class 2: Secondary | ICD-10-CM

## 2021-07-18 DIAGNOSIS — Z5329 Procedure and treatment not carried out because of patient's decision for other reasons: Secondary | ICD-10-CM | POA: Diagnosis present

## 2021-07-18 DIAGNOSIS — Z28310 Unvaccinated for covid-19: Secondary | ICD-10-CM

## 2021-07-18 LAB — CBC WITH DIFFERENTIAL/PLATELET
Abs Immature Granulocytes: 0.4 10*3/uL — ABNORMAL HIGH (ref 0.00–0.07)
Basophils Absolute: 0.2 10*3/uL — ABNORMAL HIGH (ref 0.0–0.1)
Basophils Relative: 1 %
Eosinophils Absolute: 1.8 10*3/uL — ABNORMAL HIGH (ref 0.0–0.5)
Eosinophils Relative: 8 %
HCT: 51.8 % (ref 39.0–52.0)
Hemoglobin: 17.4 g/dL — ABNORMAL HIGH (ref 13.0–17.0)
Immature Granulocytes: 2 %
Lymphocytes Relative: 15 %
Lymphs Abs: 3.6 10*3/uL (ref 0.7–4.0)
MCH: 28.8 pg (ref 26.0–34.0)
MCHC: 33.6 g/dL (ref 30.0–36.0)
MCV: 85.8 fL (ref 80.0–100.0)
Monocytes Absolute: 1.7 10*3/uL — ABNORMAL HIGH (ref 0.1–1.0)
Monocytes Relative: 7 %
Neutro Abs: 16.2 10*3/uL — ABNORMAL HIGH (ref 1.7–7.7)
Neutrophils Relative %: 67 %
Platelets: 271 10*3/uL (ref 150–400)
RBC: 6.04 MIL/uL — ABNORMAL HIGH (ref 4.22–5.81)
RDW: 13.1 % (ref 11.5–15.5)
WBC: 24 10*3/uL — ABNORMAL HIGH (ref 4.0–10.5)
nRBC: 0 % (ref 0.0–0.2)

## 2021-07-18 MED ORDER — IPRATROPIUM BROMIDE 0.02 % IN SOLN
RESPIRATORY_TRACT | Status: AC
  Administered 2021-07-18: 1 mg
  Filled 2021-07-18: qty 5

## 2021-07-18 MED ORDER — METHYLPREDNISOLONE SODIUM SUCC 125 MG IJ SOLR
125.0000 mg | Freq: Once | INTRAMUSCULAR | Status: AC
  Administered 2021-07-18: 125 mg via INTRAVENOUS
  Filled 2021-07-18: qty 2

## 2021-07-18 MED ORDER — MAGNESIUM SULFATE 2 GM/50ML IV SOLN
2.0000 g | Freq: Once | INTRAVENOUS | Status: AC
  Administered 2021-07-18: 2 g via INTRAVENOUS
  Filled 2021-07-18: qty 50

## 2021-07-18 MED ORDER — ALBUTEROL SULFATE (2.5 MG/3ML) 0.083% IN NEBU
INHALATION_SOLUTION | RESPIRATORY_TRACT | Status: AC
  Administered 2021-07-18: 15 mg
  Filled 2021-07-18: qty 18

## 2021-07-18 NOTE — ED Triage Notes (Signed)
Pt reports SOB since discharged from hospital, no relief with neb treatments or inhaler  ?

## 2021-07-18 NOTE — ED Notes (Signed)
Patient back in er after 3 days discharge for asthma exacerbation. Appears to be having another asthma attack wheezing with accessory muscle use. Sweating profusely. Started on CAT neb 15 mg albuterol / 1.0 mg atrovent over 1 hour.  ?

## 2021-07-18 NOTE — ED Provider Notes (Addendum)
?[facility] ?Provider Note ? ? ?CSN: [id number] ?Arrival date & time: 07/18/21  2240 ? ?  ? ?History ? ?Chief Complaint  ?Patient presents with  ? Shortness of Breath  ? ? ?Randy Newman. is a 41 y.o. male. ? ?Patient is a 41 year old male with history of childhood asthma and recent admission for bronchitis/respiratory distress.  Patient presenting today for evaluation of shortness of breath.  This has been ongoing for the past week.  He was admitted 1 week ago and treated with steroids and bronchodilators.  He was discharged with steroids and an albuterol nebulizer.  This morning, patient's breathing became worse.  He reports wheezing and cough that is nonproductive.  He denies any chest pain, fevers, chills, or leg swelling.  He has been using his nebulizer with little relief. ? ?The history is provided by the patient.  ?Shortness of Breath ?Severity:  Moderate ?Onset quality:  Sudden ?Duration:  1 week ?Timing:  Constant ?Progression:  Worsening ?Chronicity:  New ?Relieved by:  Nothing ?Worsened by:  Nothing ?Ineffective treatments:  None tried ? ?  ? ?Home Medications ?Prior to Admission medications   ?Medication Sig Start Date End Date Taking? Authorizing Provider  ?albuterol (PROVENTIL) (2.5 MG/3ML) 0.083% nebulizer solution Take 3 mLs (2.5 mg total) by nebulization every 6 (six) hours as needed for wheezing or shortness of breath. 07/15/21   Tyrone Nine, MD  ?azithromycin (ZITHROMAX) 250 MG tablet Take 1 tablet (250 mg total) by mouth daily for 3 days. 07/16/21 07/19/21  Tyrone Nine, MD  ?predniSONE (DELTASONE) 20 MG tablet Take 2 tablets (40 mg total) by mouth daily for 3 days. 07/16/21 07/19/21  Tyrone Nine, MD  ?   ? ?Allergies    ?Patient has no known allergies.   ? ?Review of Systems   ?Review of Systems  ?Respiratory:  Positive for shortness of breath.   ?All other systems reviewed and are negative. ? ?Physical Exam ?Updated Vital Signs ?BP (!) 155/87 (BP Location: Right  Arm)   Pulse (!) 117   Temp 97.7 ?F (36.5 ?C) (Axillary)   Resp (!) 30   Ht 6' (1.829 m)   Wt 122.5 kg   SpO2 99%   BMI 36.62 kg/m?  ?Physical Exam ?Vitals and nursing note reviewed.  ?Constitutional:   ?   General: He is not in acute distress. ?   Appearance: He is well-developed. He is diaphoretic.  ?HENT:  ?   Head: Normocephalic and atraumatic.  ?Cardiovascular:  ?   Rate and Rhythm: Normal rate and regular rhythm.  ?   Heart sounds: No murmur heard. ?  No friction rub.  ?Pulmonary:  ?   Effort: Tachypnea and accessory muscle usage present. No respiratory distress.  ?   Breath sounds: Examination of the right-middle field reveals wheezing. Examination of the left-middle field reveals wheezing. Wheezing present. No rales.  ?   Comments: Patient in moderate respiratory distress. ?Abdominal:  ?   General: Bowel sounds are normal. There is no distension.  ?   Palpations: Abdomen is soft.  ?   Tenderness: There is no abdominal tenderness.  ?Musculoskeletal:     ?   General: Normal range of motion.  ?   Cervical back: Normal range of motion and neck supple.  ?Skin: ?   General: Skin is warm.  ?Neurological:  ?   Mental Status: He is alert and oriented to person, place, and time.  ?   Coordination: Coordination normal.  ? ? ?  ED Results / Procedures / Treatments   ?Labs ?(all labs ordered are listed, but only abnormal results are displayed) ?Labs Reviewed  ?COMPREHENSIVE METABOLIC PANEL  ?CBC WITH DIFFERENTIAL/PLATELET  ?BRAIN NATRIURETIC PEPTIDE  ?TROPONIN I (HIGH SENSITIVITY)  ? ? ?EKG ?None ? ?Radiology ?No results found. ? ?Procedures ?Procedures  ? ? ?Medications Ordered in ED ?Medications  ?methylPREDNISolone sodium succinate (SOLU-MEDROL) 125 mg/2 mL injection 125 mg (has no administration in time range)  ?magnesium sulfate IVPB 2 g 50 mL (has no administration in time range)  ?albuterol (PROVENTIL) (2.5 MG/3ML) 0.083% nebulizer solution (15 mg  Given 07/18/21 2305)  ?ipratropium (ATROVENT) 0.02 % nebulizer  solution (1 mg  Given 07/18/21 2306)  ? ? ?ED Course/ Medical Decision Making/ A&P ? ?This patient presents to the ED for concern of shortness of breath, this involves an extensive number of treatment options, and is a complaint that carries with it a high risk of complications and morbidity.  The differential diagnosis includes asthma exacerbation, pulmonary embolism, pneumonia ? ? ?Co morbidities that complicate the patient evaluation ? ?None ? ? ?Additional history obtained: ? ?No additional history or external records needed ? ?Lab Tests: ? ?I Ordered, and personally interpreted labs.  The pertinent results include: CBC, metabolic panel, BNP, all of which are unremarkable ? ? ?Imaging Studies ordered: ? ?I ordered imaging studies including chest x-ray ?I independently visualized and interpreted imaging which showed no acute process ?I agree with the radiologist interpretation ? ? ?Cardiac Monitoring: / EKG: ? ?The patient was maintained on a cardiac monitor.  I personally viewed and interpreted the cardiac monitored which showed an underlying rhythm of: Sinus rhythm/sinus tachycardia ? ? ?Consultations Obtained: ? ?I requested consultation with the hospitalist,  and discussed lab and imaging findings as well as pertinent plan - they recommend: Admission for further treatment ? ? ?Problem List / ED Course / Critical interventions / Medication management ? ?Patient presenting with wheezing and shortness of breath.  He has recently diagnosed asthma.  Patient given a continuous albuterol treatment along with magnesium and Solu-Medrol, symptoms persist.  Due to work of breathing, patient was placed on BiPAP.  After approximately 1 hour, the BiPAP was removed and patient weaned to nasal cannula.  An attempt at room air because oxygen saturations to drop into the mid 80s.  Patient now requiring 4 L nasal cannula to maintain saturations in the mid 90s.  I feel as though he will require further treatment and  hospitalization. ?I ordered medication including Solu-Medrol, albuterol, and magnesium for asthma exacerbation ?Reevaluation of the patient after these medicines showed that the patient improved ?I have reviewed the patients home medicines and have made adjustments as needed ? ? ?Social Determinants of Health: ? ?Patient does smoke cigarettes, however has not smoked since his recent hospitalization ? ? ?Test / Admission - Considered: ? ?Patient to be admitted due to his hypoxia and work of breathing. ? ? ?CRITICAL CARE ?Performed by: Geoffery Lyonsouglas Dara Camargo ?Total critical care time: 40 minutes ?Critical care time was exclusive of separately billable procedures and treating other patients. ?Critical care was necessary to treat or prevent imminent or life-threatening deterioration. ?Critical care was time spent personally by me on the following activities: development of treatment plan with patient and/or surrogate as well as nursing, discussions with consultants, evaluation of patient's response to treatment, examination of patient, obtaining history from patient or surrogate, ordering and performing treatments and interventions, ordering and review of laboratory studies, ordering and review of radiographic studies,  pulse oximetry and re-evaluation of patient's condition. ? ? ?Final Clinical Impression(s) / ED Diagnoses ?Final diagnoses:  ?None  ? ? ?Rx / DC Orders ?ED Discharge Orders   ? ? None  ? ?  ? ? ?  ?Geoffery Lyons, MD ?07/19/21 [phone] ? ?  ?Geoffery Lyons, MD ?07/19/21 [phone] ? ?

## 2021-07-19 ENCOUNTER — Inpatient Hospital Stay (HOSPITAL_COMMUNITY): Payer: 59

## 2021-07-19 DIAGNOSIS — J441 Chronic obstructive pulmonary disease with (acute) exacerbation: Secondary | ICD-10-CM | POA: Diagnosis not present

## 2021-07-19 DIAGNOSIS — Z28310 Unvaccinated for covid-19: Secondary | ICD-10-CM | POA: Diagnosis not present

## 2021-07-19 DIAGNOSIS — Z5329 Procedure and treatment not carried out because of patient's decision for other reasons: Secondary | ICD-10-CM | POA: Diagnosis not present

## 2021-07-19 DIAGNOSIS — F17210 Nicotine dependence, cigarettes, uncomplicated: Secondary | ICD-10-CM | POA: Diagnosis present

## 2021-07-19 DIAGNOSIS — D72829 Elevated white blood cell count, unspecified: Secondary | ICD-10-CM | POA: Diagnosis not present

## 2021-07-19 DIAGNOSIS — Z6835 Body mass index (BMI) 35.0-35.9, adult: Secondary | ICD-10-CM | POA: Diagnosis not present

## 2021-07-19 DIAGNOSIS — J9601 Acute respiratory failure with hypoxia: Principal | ICD-10-CM

## 2021-07-19 DIAGNOSIS — R69 Illness, unspecified: Secondary | ICD-10-CM | POA: Diagnosis not present

## 2021-07-19 DIAGNOSIS — Z720 Tobacco use: Secondary | ICD-10-CM

## 2021-07-19 DIAGNOSIS — E669 Obesity, unspecified: Secondary | ICD-10-CM | POA: Diagnosis not present

## 2021-07-19 DIAGNOSIS — R197 Diarrhea, unspecified: Secondary | ICD-10-CM | POA: Diagnosis not present

## 2021-07-19 DIAGNOSIS — K219 Gastro-esophageal reflux disease without esophagitis: Secondary | ICD-10-CM | POA: Diagnosis not present

## 2021-07-19 DIAGNOSIS — J4521 Mild intermittent asthma with (acute) exacerbation: Secondary | ICD-10-CM | POA: Diagnosis not present

## 2021-07-19 DIAGNOSIS — J45901 Unspecified asthma with (acute) exacerbation: Secondary | ICD-10-CM

## 2021-07-19 LAB — CBC WITH DIFFERENTIAL/PLATELET
Abs Immature Granulocytes: 0.41 10*3/uL — ABNORMAL HIGH (ref 0.00–0.07)
Basophils Absolute: 0.1 10*3/uL (ref 0.0–0.1)
Basophils Relative: 1 %
Eosinophils Absolute: 0.1 10*3/uL (ref 0.0–0.5)
Eosinophils Relative: 0 %
HCT: 52.9 % — ABNORMAL HIGH (ref 39.0–52.0)
Hemoglobin: 17.4 g/dL — ABNORMAL HIGH (ref 13.0–17.0)
Immature Granulocytes: 2 %
Lymphocytes Relative: 7 %
Lymphs Abs: 1.2 10*3/uL (ref 0.7–4.0)
MCH: 28.2 pg (ref 26.0–34.0)
MCHC: 32.9 g/dL (ref 30.0–36.0)
MCV: 85.7 fL (ref 80.0–100.0)
Monocytes Absolute: 0.3 10*3/uL (ref 0.1–1.0)
Monocytes Relative: 2 %
Neutro Abs: 15.8 10*3/uL — ABNORMAL HIGH (ref 1.7–7.7)
Neutrophils Relative %: 88 %
Platelets: 261 10*3/uL (ref 150–400)
RBC: 6.17 MIL/uL — ABNORMAL HIGH (ref 4.22–5.81)
RDW: 13 % (ref 11.5–15.5)
WBC: 17.9 10*3/uL — ABNORMAL HIGH (ref 4.0–10.5)
nRBC: 0 % (ref 0.0–0.2)

## 2021-07-19 LAB — COMPREHENSIVE METABOLIC PANEL
ALT: 54 U/L — ABNORMAL HIGH (ref 0–44)
ALT: 51 U/L — ABNORMAL HIGH (ref 0–44)
AST: 46 U/L — ABNORMAL HIGH (ref 15–41)
AST: 36 U/L (ref 15–41)
Albumin: 4.3 g/dL (ref 3.5–5.0)
Albumin: 4.3 g/dL (ref 3.5–5.0)
Alkaline Phosphatase: 66 U/L (ref 38–126)
Alkaline Phosphatase: 68 U/L (ref 38–126)
Anion gap: 10 (ref 5–15)
Anion gap: 10 (ref 5–15)
BUN: 8 mg/dL (ref 6–20)
BUN: 10 mg/dL (ref 6–20)
CO2: 27 mmol/L (ref 22–32)
CO2: 27 mmol/L (ref 22–32)
Calcium: 9.3 mg/dL (ref 8.9–10.3)
Calcium: 9.1 mg/dL (ref 8.9–10.3)
Chloride: 102 mmol/L (ref 98–111)
Chloride: 101 mmol/L (ref 98–111)
Creatinine, Ser: 0.83 mg/dL (ref 0.61–1.24)
Creatinine, Ser: 0.84 mg/dL (ref 0.61–1.24)
GFR, Estimated: >60 mL/min (ref >60)
GFR, Estimated: >60 mL/min (ref >60)
Glucose, Bld: 111 mg/dL — ABNORMAL HIGH (ref 70–99)
Glucose, Bld: 153 mg/dL — ABNORMAL HIGH (ref 70–99)
Potassium: 3.7 mmol/L (ref 3.5–5.1)
Potassium: 4.5 mmol/L (ref 3.5–5.1)
Sodium: 139 mmol/L (ref 135–145)
Sodium: 138 mmol/L (ref 135–145)
Total Bilirubin: 0.6 mg/dL (ref 0.3–1.2)
Total Bilirubin: 0.6 mg/dL (ref 0.3–1.2)
Total Protein: 7.8 g/dL (ref 6.5–8.1)
Total Protein: 7.9 g/dL (ref 6.5–8.1)

## 2021-07-19 LAB — MRSA NEXT GEN BY PCR, NASAL: MRSA by PCR Next Gen: NOT DETECTED

## 2021-07-19 LAB — PROCALCITONIN: Procalcitonin: <0.1 ng/mL

## 2021-07-19 LAB — BRAIN NATRIURETIC PEPTIDE: B Natriuretic Peptide: 39 pg/mL (ref 0.0–100.0)

## 2021-07-19 LAB — MAGNESIUM: Magnesium: 2.5 mg/dL — ABNORMAL HIGH (ref 1.7–2.4)

## 2021-07-19 LAB — D-DIMER, QUANTITATIVE: D-Dimer, Quant: 0.45 ug/mL-FEU (ref 0.00–0.50)

## 2021-07-19 LAB — BLOOD GAS, VENOUS
Acid-Base Excess: 7.5 mmol/L — ABNORMAL HIGH (ref 0.0–2.0)
Bicarbonate: 33.2 mmol/L — ABNORMAL HIGH (ref 20.0–28.0)
Drawn by: 5212
O2 Saturation: 95.1 %
Patient temperature: 36.5
pCO2, Ven: 49 mmHg (ref 44–60)
pH, Ven: 7.44 — ABNORMAL HIGH (ref 7.25–7.43)
pO2, Ven: 68 mmHg — ABNORMAL HIGH (ref 32–45)

## 2021-07-19 LAB — TROPONIN I (HIGH SENSITIVITY)
Troponin I (High Sensitivity): 5 ng/L (ref <18)
Troponin I (High Sensitivity): 5 ng/L (ref <18)

## 2021-07-19 MED ORDER — ALBUTEROL SULFATE (2.5 MG/3ML) 0.083% IN NEBU
INHALATION_SOLUTION | RESPIRATORY_TRACT | Status: AC
  Administered 2021-07-19: 2.5 mg
  Filled 2021-07-19: qty 3

## 2021-07-19 MED ORDER — MORPHINE SULFATE (PF) 2 MG/ML IV SOLN: 2.0000 mg | INTRAVENOUS | Status: DC | PRN

## 2021-07-19 MED ORDER — ACETAMINOPHEN 650 MG RE SUPP: 650.0000 mg | Freq: Four times a day (QID) | RECTAL | Status: DC | PRN

## 2021-07-19 MED ORDER — METHYLPREDNISOLONE SODIUM SUCC 125 MG IJ SOLR
125.0000 mg | Freq: Every day | INTRAMUSCULAR | Status: AC
  Administered 2021-07-19: 125 mg via INTRAVENOUS
  Filled 2021-07-19: qty 2

## 2021-07-19 MED ORDER — HYDROCODONE BIT-HOMATROP MBR 5-1.5 MG/5ML PO SOLN
5.0000 mL | Freq: Four times a day (QID) | ORAL | Status: DC | PRN
  Administered 2021-07-20 – 2021-07-21 (×6): 5 mL via ORAL
  Filled 2021-07-19 (×6): qty 5

## 2021-07-19 MED ORDER — OXYCODONE HCL 5 MG PO TABS: 5.0000 mg | ORAL_TABLET | Freq: Four times a day (QID) | ORAL | Status: DC | PRN

## 2021-07-19 MED ORDER — ARFORMOTEROL TARTRATE 15 MCG/2ML IN NEBU
15.0000 ug | INHALATION_SOLUTION | Freq: Two times a day (BID) | RESPIRATORY_TRACT | Status: DC
  Administered 2021-07-19 – 2021-07-21 (×5): 15 ug via RESPIRATORY_TRACT
  Filled 2021-07-19 (×6): qty 2

## 2021-07-19 MED ORDER — CHLORHEXIDINE GLUCONATE CLOTH 2 % EX PADS
6.0000 | MEDICATED_PAD | Freq: Every day | CUTANEOUS | Status: DC
  Administered 2021-07-19 – 2021-07-21 (×3): 6 via TOPICAL

## 2021-07-19 MED ORDER — IOHEXOL 350 MG/ML SOLN
100.0000 mL | Freq: Once | INTRAVENOUS | Status: AC | PRN
  Administered 2021-07-19: 75 mL via INTRAVENOUS

## 2021-07-19 MED ORDER — LEVALBUTEROL HCL 0.63 MG/3ML IN NEBU
INHALATION_SOLUTION | RESPIRATORY_TRACT | Status: AC
  Filled 2021-07-19: qty 3

## 2021-07-19 MED ORDER — ALBUTEROL SULFATE (2.5 MG/3ML) 0.083% IN NEBU: 2.5000 mg | INHALATION_SOLUTION | RESPIRATORY_TRACT | Status: DC | PRN

## 2021-07-19 MED ORDER — HEPARIN SODIUM (PORCINE) 5000 UNIT/ML IJ SOLN
5000.0000 [IU] | Freq: Three times a day (TID) | INTRAMUSCULAR | Status: DC
  Administered 2021-07-19 – 2021-07-21 (×8): 5000 [IU] via SUBCUTANEOUS
  Filled 2021-07-19 (×8): qty 1

## 2021-07-19 MED ORDER — IPRATROPIUM-ALBUTEROL 0.5-2.5 (3) MG/3ML IN SOLN
3.0000 mL | Freq: Four times a day (QID) | RESPIRATORY_TRACT | Status: DC
  Administered 2021-07-19 – 2021-07-21 (×9): 3 mL via RESPIRATORY_TRACT
  Filled 2021-07-19 (×9): qty 3

## 2021-07-19 MED ORDER — ACETAMINOPHEN 325 MG PO TABS: 650.0000 mg | ORAL_TABLET | Freq: Four times a day (QID) | ORAL | Status: DC | PRN

## 2021-07-19 MED ORDER — ONDANSETRON HCL 4 MG/2ML IJ SOLN: 4.0000 mg | Freq: Four times a day (QID) | INTRAMUSCULAR | Status: DC | PRN

## 2021-07-19 MED ORDER — FLUTICASONE FUROATE-VILANTEROL 100-25 MCG/ACT IN AEPB
1.0000 | INHALATION_SPRAY | Freq: Every day | RESPIRATORY_TRACT | Status: DC
  Filled 2021-07-19: qty 28

## 2021-07-19 MED ORDER — PREDNISONE 20 MG PO TABS
40.0000 mg | ORAL_TABLET | Freq: Every day | ORAL | Status: DC
  Administered 2021-07-20 – 2021-07-21 (×2): 40 mg via ORAL
  Filled 2021-07-19 (×2): qty 2

## 2021-07-19 MED ORDER — BUDESONIDE 0.5 MG/2ML IN SUSP
0.5000 mg | Freq: Two times a day (BID) | RESPIRATORY_TRACT | Status: DC
  Administered 2021-07-19 – 2021-07-21 (×5): 0.5 mg via RESPIRATORY_TRACT
  Filled 2021-07-19 (×5): qty 2

## 2021-07-19 MED ORDER — IPRATROPIUM-ALBUTEROL 0.5-2.5 (3) MG/3ML IN SOLN
RESPIRATORY_TRACT | Status: AC
  Administered 2021-07-19: 3 mL
  Filled 2021-07-19: qty 3

## 2021-07-19 MED ORDER — OXYCODONE HCL 5 MG PO TABS: 5.0000 mg | ORAL_TABLET | ORAL | Status: DC | PRN

## 2021-07-19 MED ORDER — MONTELUKAST SODIUM 10 MG PO TABS
10.0000 mg | ORAL_TABLET | Freq: Every day | ORAL | Status: DC
  Administered 2021-07-19 – 2021-07-20 (×2): 10 mg via ORAL
  Filled 2021-07-19 (×2): qty 1

## 2021-07-19 MED ORDER — LEVALBUTEROL HCL 0.63 MG/3ML IN NEBU
0.6300 mg | INHALATION_SOLUTION | Freq: Once | RESPIRATORY_TRACT | Status: AC
  Administered 2021-07-19: 0.63 mg via RESPIRATORY_TRACT

## 2021-07-19 MED ORDER — AZITHROMYCIN 250 MG PO TABS: 250.0000 mg | ORAL_TABLET | Freq: Every day | ORAL | Status: DC

## 2021-07-19 MED ORDER — ONDANSETRON HCL 4 MG PO TABS
4.0000 mg | ORAL_TABLET | Freq: Four times a day (QID) | ORAL | Status: DC | PRN
Start: 2021-07-19 — End: 2021-07-22

## 2021-07-19 NOTE — ED Notes (Signed)
Patient resting in bed, lung sound assessed noted expiratory wheezing bilaterally. Notified Respiratory regarding Duoneb treatment.  ?

## 2021-07-19 NOTE — ED Notes (Signed)
Placed ON BIPAP as patient is still having increased WOB with accessory muscle use. ?

## 2021-07-19 NOTE — Progress Notes (Signed)
Called to bedside for respiratory distress. Patient diaphoretic and working hard to breathe. No wheeze on exam. HR 155, BP 148/84. O2 sats 90s on nasal cannula. Patient reports feeling near syncopal vs actually having a syncopal event. Ordered xoponex (because of elevated HR) to be given now, and to put patient back on BiPAP. Ordered CTA PE study - pending results.  ?

## 2021-07-19 NOTE — ED Notes (Signed)
Dr Judd Lien made aware of pt vitals and respiratory status ?

## 2021-07-19 NOTE — ED Notes (Signed)
Pt resting comfortably.. refused nebulizer treatment. Pt says "nebulizers don't help me." ?

## 2021-07-19 NOTE — ED Notes (Signed)
Report called to [facility] in ICU.  ?

## 2021-07-19 NOTE — Progress Notes (Signed)
Patient seen and examined; admitted after midnight secondary to acute resp distress and hypoxia due to asthma exacerbation, recent admission (discharge 72 hours ago after similar admission) and discharge home with instruction to complete antibiotic for bronchitis, to stop smoking and to complete steroid tapering therapy. Patient reported symptoms began again approx 24 hours after discharge and has continue worsening since, reported to be compliant with medications. Patient is still smoking. He was discharge w/o oxygen supplementation. Very tachypneic on presentation and briefly requiring BIPAP for work of breathing. Please refer to H&P written by Dr. Carren Rang for further info/details about his admission. ? ?Plan: ?-close monitoring in stepdown at least for the next 24 hours do to earlier BIPAP requirement. ?-continue steroids and bronchodilators management ?-starting singulair, pulmicort and brovana. ?-encouraged to stop smoking. ?-wean off oxygen supplementation as tolerated. ?-continue PPI ?-No antibiotics; as there is not changes suggesting infection currently. ?-follow clinical response. ? ?Vassie Loll MD ?[phone] ? ?

## 2021-07-19 NOTE — Assessment & Plan Note (Addendum)
-  Patient reports that he has quit smoking ?-Declining nicotine patch currently.   ? ?

## 2021-07-19 NOTE — H&P (Signed)
?History and Physical  ? ? ?Patient: Randy Krasngel Garcia Rutan Jr. ZOX:[id number] DOB: 1981/04/17 ?DOA: 07/18/2021 ?DOS: the patient was seen and examined on 07/19/2021 ?PCP: Pcp, No  ?Patient coming from: Home ? ?Chief Complaint:  ?Chief Complaint  ?Patient presents with  ? Shortness of Breath  ? ?HPI: Randy Krasngel Garcia Hardage Jr. is a 41 y.o. male with medical history significant of with history of recent hospitalization for dyspnea, tobacco use disorder, and childhood history of asthma, presents the ED with a chief complaint of dyspnea.  Patient reports that it started 1 week ago.  He has had significant wheezing and dry cough.  He has been using his girlfriends rescue inhaler which helps, but does not resolve the symptoms.  The dyspnea comes right back.  Patient reports that he had childhood asthma, but never had an inhaler treated it.  He has never had a problem since then until this last week.  After discussing a long list of potential new exposures, patient reports he has new laundry detergent but that he has been using it for couple of months.  [place], so should be acclimated to heavy pollen load.  Patient has no exposure to chemicals recently.  He has had no aspiration recently.  No new pets, no new home environment.  Patient has no history of long trips in the car, train, on a plane.  He has no personal or family history of blood clot.  Patient does report right lower chest wall pain.  It is worse with coughing.  He also reports a sensation that he cannot take a deep breath.  He reports that BiPAP helps with that sensation, but now that he is off the BiPAP he feels like he is breathing very shallow again.  Patient reports since all this started he is also had diarrhea 3 times a day.  It is nonbloody.  Patient has no other complaints at this time. ? ?Patient is a current smoker 1 pack-1 and half packs per day.  He does not drink alcohol, he does not use illicit drugs.  He is not  vaccinated for COVID.  Patient is full code. ? ?Chart review reveals that patient was discharged from the hospital on March 28.  The hospitalization was for acute hypoxic respiratory failure as well.  He was diagnosed with acute bronchitis.  He had no bacterial consolidation on chest x-ray, negative flu and COVID, and negative full viral panel.  He had steady improvement while in the hospital.  He was treated with empiric Zithromax x5 days, and steroids x5 days.  Nebulizer was ordered for home, but patient does not have the nebulizer at home.  Patient also had a leukocytosis at that admission which was determined to be reactive. ?Review of Systems: As mentioned in the history of present illness. All other systems reviewed and are negative. ?Past Medical History:  ?Diagnosis Date  ? Asthma   ? ?History reviewed. No pertinent surgical history. ?Social History:  reports that he has been smoking cigarettes. He has a 3.00 pack-year smoking history. He has never used smokeless tobacco. He reports that he does not currently use alcohol. He reports that he does not currently use drugs. ? ?No Known Allergies ? ?History reviewed. No pertinent family history. ?No family history of blood clot, or lung disease ? ?Prior to Admission medications   ?Medication Sig Start Date End Date Taking? Authorizing Provider  ?albuterol (PROVENTIL) (2.5 MG/3ML) 0.083% nebulizer solution Take 3 mLs (2.5 mg  total) by nebulization every 6 (six) hours as needed for wheezing or shortness of breath. 07/15/21   Tyrone Nine, MD  ?azithromycin (ZITHROMAX) 250 MG tablet Take 1 tablet (250 mg total) by mouth daily for 3 days. 07/16/21 07/19/21  Tyrone Nine, MD  ?predniSONE (DELTASONE) 20 MG tablet Take 2 tablets (40 mg total) by mouth daily for 3 days. 07/16/21 07/19/21  Tyrone Nine, MD  ? ? ?Physical Exam: ?Vitals:  ? 07/19/21 0232 07/19/21 0300 07/19/21 0328 07/19/21 0400  ?BP:  (!) 148/96  140/81  ?Pulse:  (!) 113    ?Resp:  (!) 29  (!) 29  ?Temp:       ?TempSrc:      ?SpO2: 96% 97% 95%   ?Weight:      ?Height:      ? ?1.  General: ?Patient lying supine in bed,  no acute distress ?  ?2. Psychiatric: ?Alert and oriented x 3, mood and behavior normal for situation, pleasant and cooperative with exam ?  ?3. Neurologic: ?Speech and language are normal, face is symmetric, moves all 4 extremities voluntarily, at baseline without acute deficits on limited exam ?  ?4. HEENMT:  ?Head is atraumatic, normocephalic, pupils reactive to light, neck is supple, trachea is midline, mucous membranes are moist ?  ?5. Respiratory : ?Diffuse wheezing with increased work of breathing, speaking in full sentences but using accessory muscles, nasal flaring, no cyanosis, Rales or rhonchi ?  ?6. Cardiovascular : ?Heart rate tachycardic, rhythm is regular, no murmurs, rubs or gallops, no peripheral edema, peripheral pulses palpated ?  ?7. Gastrointestinal:  ?Abdomen is soft, nondistended, nontender to palpation bowel sounds active, no masses or organomegaly palpated ?  ?8. Skin:  ?Skin is warm, dry and intact without rashes, acute lesions, or ulcers on limited exam ?  ?9.Musculoskeletal:  ?No acute deformities or trauma, no asymmetry in tone, no peripheral edema, peripheral pulses palpated, no tenderness to palpation in the extremities ? 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 ?Assessment  and Plan: ?* Acute respiratory failure with hypoxia (HCC) ?-Patient in respiratory distress presentation with respiratory rate 35 and oxygen sats 85% ?-Patient requiring 4 L nasal cannula to maintain oxygen sats but to improve work of breathing is requiring BiPAP ?-Trope 5, 5 ?-Viral respiratory panel on March 27 was negative ?-Chest x-ray shows no active disease ?-Patient has significant wheeze which lowers the suspicion for PE, but with tachycardia, hypoxia, and failure previous treatment-we will check a D-dimer for completeness ?-Patient is a leukocytosis at 24, but is afebrile -recently treated empirically with 5 days of Zithromax ?-Given diarrhea and dyspnea-check Legionella antigen ?-Continue BiPAP as needed ?-Continue scheduled and as needed breathing treatments ?-Continue steroid ?No antibiotics at this time ? ?Tobacco abuse ?Patient reports that he has not smoked since he was discharged from the hospital. ?Declines nicotine patch at this time ?Agreeable to  continue quitting ?Counseled on the importance of abstaining from cigarettes ? ?Leukocytosis ?-Leukocytosis is up from discharge ?-Likely acute phase reactant ?-Patient has been on steroid, could also be contributing- likely would have been elevated at discharge if that were the dominant factor ?-Check procalcitonin ?-No evidence of pneumonia on chest x-ray, patient afebrile ?-No antibiotics started at this time ? ? ? ? ? ? Advance Care Planning:   Code Status: Full Code  ? ?Consults: None ? ?Family Communication: No family at bedside ? ?Severity of Illness: ?The appropriate patient status for this patient is INPATIENT. Inpatient status is judged to be reasonable and necessary in order to provide the required intensity of service to ensure the patient's safety. The patient's presenting symptoms, physical exam findings, and initial radiographic and laboratory data in the context of their chronic comorbidities is felt to place them at high risk for further  clinical deterioration. Furthermore, it is not anticipated that the patient will be medically stable for discharge from the hospital within 2 midnights of admission.  ? ?* I certify that at the point of admissi

## 2021-07-19 NOTE — ED Notes (Addendum)
Placed on 4 liters as Patients sats dropped  to 85 on room air. ?

## 2021-07-19 NOTE — Progress Notes (Signed)
Transported patient to and from CT on  ?Bipap without incident. ?

## 2021-07-19 NOTE — ED Notes (Signed)
Taken off BiPAP as requested by MD, Patient still has increased WOB. Wheezing more on left, decreased rt side. ?

## 2021-07-19 NOTE — Assessment & Plan Note (Addendum)
-  Patient in respiratory distress presentation with respiratory rate of 35 and oxygen sats down to 85% on room air at time of admission. ?-Initially requiring up to 4 L and subsequently transient use of BiPAP to stabilize respiratory distress. ?-Chest x-ray demonstrating no acute cardiopulmonary process; recent admission with completion of antibiotics as an outpatient. ?-CT angiogram limited with poor contrast but not demonstrating large embolism.  Patient reports no chest pain and is currently not having any elevated heart rate or hemodynamic instability. ?-Patient reported reactive airway disease after experiencing uncontrolled coughing spells. ?-Will continue management with the use of steroids, nebulizer, antitussive medication and twice a day PPI.  Patient also started on Pepcid nightly. ?-Continue antitussive medications. ?-Will continue the use of Singulair. ?-Continue to wean off oxygen supplementation as tolerated. ?

## 2021-07-19 NOTE — Assessment & Plan Note (Addendum)
-  Reactive most likely ?-No acute infection appreciated ?-Continue to follow WBCs trend; trending down appropriately despite the use of steroids. ?-Maintain adequate hydration. ?-Continue holding on antibiotics currently. ? ?

## 2021-07-20 DIAGNOSIS — K219 Gastro-esophageal reflux disease without esophagitis: Secondary | ICD-10-CM

## 2021-07-20 DIAGNOSIS — J441 Chronic obstructive pulmonary disease with (acute) exacerbation: Secondary | ICD-10-CM

## 2021-07-20 DIAGNOSIS — E669 Obesity, unspecified: Secondary | ICD-10-CM

## 2021-07-20 MED ORDER — PANTOPRAZOLE SODIUM 40 MG PO TBEC
40.0000 mg | DELAYED_RELEASE_TABLET | Freq: Two times a day (BID) | ORAL | Status: DC
  Administered 2021-07-20 – 2021-07-21 (×3): 40 mg via ORAL
  Filled 2021-07-20 (×3): qty 1

## 2021-07-20 NOTE — Progress Notes (Signed)
Bipap is still at bedside.  Patient on 3L [place] with a sat of 95%, no distress noted. ?

## 2021-07-20 NOTE — Assessment & Plan Note (Addendum)
-  PPI twice a day has been started; will also initiate nightly Pepcid.Marland Kitchen ?

## 2021-07-20 NOTE — Assessment & Plan Note (Signed)
-  Body mass index is 35.67 kg/m?. ?-Low calorie diet, portion control and increase physical activity discussed with patient. ?

## 2021-07-20 NOTE — Progress Notes (Signed)
?Progress Note ? ? ?Patient: Randy Newman. ALP:[id number] DOB: 1980-05-21 DOA: 07/18/2021     1 ?DOS: the patient was seen and examined on 07/20/2021 ?  ?Brief hospital admission narrative course: ?As per H&P written by Dr. Carren Rang on 07/19/21 ?[name]. is a 41 y.o. male with medical history significant of with history of recent hospitalization for dyspnea, tobacco use disorder, and childhood history of asthma, presents the ED with a chief complaint of dyspnea.  Patient reports that it started 1 week ago.  He has had significant wheezing and dry cough.  He has been using his girlfriends rescue inhaler which helps, but does not resolve the symptoms.  The dyspnea comes right back.  Patient reports that he had childhood asthma, but never had an inhaler treated it.  He has never had a problem since then until this last week.  After discussing a long list of potential new exposures, patient reports he has new laundry detergent but that he has been using it for couple of months.  Patient is not new to [place], so should be acclimated to heavy pollen load.  Patient has no exposure to chemicals recently.  He has had no aspiration recently.  No new pets, no new home environment.  Patient has no history of long trips in the car, train, on a plane.  He has no personal or family history of blood clot.  Patient does report right lower chest wall pain.  It is worse with coughing.  He also reports a sensation that he cannot take a deep breath.  He reports that BiPAP helps with that sensation, but now that he is off the BiPAP he feels like he is breathing very shallow again.  Patient reports since all this started he is also had diarrhea 3 times a day.  It is nonbloody.  Patient has no other complaints at this time. ?  ?Patient is a current smoker 1 pack-1 and half packs per day.  He does not drink alcohol, he does not use illicit drugs.  He is not vaccinated for COVID.  Patient is full code. ?   ?Chart review reveals that patient was discharged from the hospital on March 28.  The hospitalization was for acute hypoxic respiratory failure as well.  He was diagnosed with acute bronchitis.  He had no bacterial consolidation on chest x-ray, negative flu and COVID, and negative full viral panel.  He had steady improvement while in the hospital.  He was treated with empiric Zithromax x5 days, and steroids x5 days.  Nebulizer was ordered for home, but patient does not have the nebulizer at home.  Patient also had a leukocytosis at that admission which was determined to be reactive. ? ?Assessment and Plan: ?* Acute respiratory failure with hypoxia (HCC) ?-Patient in respiratory distress presentation with respiratory rate of 35 and oxygen sats down to 85% on room air at time of admission. ?-Initially requiring up to 4 L and subsequently transient use of BiPAP to stabilize respiratory distress. ?-Chest x-ray demonstrating no acute cardiopulmonary process; recent admission with completion of antibiotics as an outpatient. ?-CT angiogram limited with poor contrast but not demonstrating large embolism.  Patient reports no chest pain and is currently not having any elevated heart rate or hemodynamic instability. ?-Patient reported reactive airway disease after experiencing uncontrolled coughing spells. ?-Will continue management with the use of steroids, nebulizer, antitussive medication and twice a day PPI. ?-Will also start the use of Singulair. ?-Wean off oxygen  supplementation as tolerated. ? ?GERD (gastroesophageal reflux disease) ?- PPI twice a day has been started. ? ?Class 2 obesity ?-Body mass index is 35.67 kg/m?. ?-Low calorie diet, portion control and increase physical activity discussed with patient. ? ?Tobacco abuse ?-Patient reports that he has quit smoking ?-Declining nicotine patch currently.   ? ? ?Leukocytosis ?-Reactive most likely ?-No acute infection appreciated ?-Continue to follow WBCs  trend ?-Maintain adequate hydration. ?-Holding on antibiotics currently. ? ? ? ? ?Subjective:  ?Overnight required transient use of BiPAP after reactive airway disease episode following uncontrolled coughing spells. Currently on 2L [place] supplementation and feeling better. Denies CP. ? ?Physical Exam: ?Vitals:  ? 07/20/21 1318 07/20/21 1332 07/20/21 1356 07/20/21 1400  ?BP:    (!) 160/87  ?Pulse: 86 88 75 81  ?Resp: (!) 31 19 16  (!) 21  ?Temp:      ?TempSrc:      ?SpO2: 92% 94% 93% 93%  ?Weight:      ?Height:      ? ?General exam: Alert, awake, oriented x 3, afebrile, no chest pain, no nausea or vomiting.  Using 2 L nasal cannula supplementation currently and feeling slightly better.  Chest mild wheezing appreciated on exam. ?Respiratory system: Positive wheezing bilaterally; not using accessory muscles.  2 L.  Tachypneic with activity. ?Cardiovascular system:RRR. No murmurs, rubs, gallops.  No JVD. ?Gastrointestinal system: Abdomen is nondistended, soft and nontender. No organomegaly or masses felt. Normal bowel sounds heard. ?Central nervous system: Alert and oriented. No focal neurological deficits. ?Extremities: No cyanosis or clubbing. ?Skin: No petechiae. ?Psychiatry: Judgement and insight appear normal. Mood & affect appropriate.  ? ?Data Reviewed: ?CT angiogram demonstrating no frank pulmonary embolism (unfortunately poor contrast bolus). ?Procalcitonin less than 0.10 ?Negative MRSA PCR. ? ?Family Communication:  ? ?Disposition: ?Status is: Inpatient ?Remains inpatient appropriate because: Still requiring treatment for acute asthma exacerbation.  Patient with transient need of BiPAP overnight. ? ? Planned Discharge Destination: Home ? ?Author: ? , MD ?07/20/2021 2:57 PM ? ?For on call review [email or web address].  ?

## 2021-07-21 LAB — BASIC METABOLIC PANEL
Anion gap: 10 (ref 5–15)
BUN: 18 mg/dL (ref 6–20)
CO2: 28 mmol/L (ref 22–32)
Calcium: 8.9 mg/dL (ref 8.9–10.3)
Chloride: 100 mmol/L (ref 98–111)
Creatinine, Ser: 0.94 mg/dL (ref 0.61–1.24)
GFR, Estimated: >60 mL/min (ref >60)
Glucose, Bld: 98 mg/dL (ref 70–99)
Potassium: 3.5 mmol/L (ref 3.5–5.1)
Sodium: 138 mmol/L (ref 135–145)

## 2021-07-21 LAB — CBC
HCT: 52.1 % — ABNORMAL HIGH (ref 39.0–52.0)
Hemoglobin: 17 g/dL (ref 13.0–17.0)
MCH: 28.1 pg (ref 26.0–34.0)
MCHC: 32.6 g/dL (ref 30.0–36.0)
MCV: 86 fL (ref 80.0–100.0)
Platelets: 244 10*3/uL (ref 150–400)
RBC: 6.06 MIL/uL — ABNORMAL HIGH (ref 4.22–5.81)
RDW: 12.9 % (ref 11.5–15.5)
WBC: 15.5 10*3/uL — ABNORMAL HIGH (ref 4.0–10.5)
nRBC: 0 % (ref 0.0–0.2)

## 2021-07-21 MED ORDER — FAMOTIDINE 20 MG PO TABS: 20.0000 mg | ORAL_TABLET | Freq: Every day | ORAL | Status: DC

## 2021-07-21 MED ORDER — LORAZEPAM 1 MG PO TABS: 1.0000 mg | ORAL_TABLET | Freq: Three times a day (TID) | ORAL | Status: DC | PRN

## 2021-07-21 NOTE — Progress Notes (Signed)
Security reports that while searching the [facility] campus for the patient, they reviewed security camera footage and determined that a man matching the patient and accompanying family members descriptions got into a car in the parking lot and drove away at [employer].  MD made aware that pt left AMA.  ?

## 2021-07-21 NOTE — Progress Notes (Signed)
?Progress Note ? ? ?Patient: Randy Newman. EYC:[id number] DOB: 07/22/1980 DOA: 07/18/2021     2 ?DOS: the patient was seen and examined on 07/21/2021 ?  ?Brief hospital admission narrative course: ?As per H&P written by Dr. Carren Rang on 07/19/21 ?Randy Newman. is a 41 y.o. male with medical history significant of with history of recent hospitalization for dyspnea, tobacco use disorder, and childhood history of asthma, presents the ED with a chief complaint of dyspnea.  Patient reports that it started 1 week ago.  He has had significant wheezing and dry cough.  He has been using his girlfriends rescue inhaler which helps, but does not resolve the symptoms.  The dyspnea comes right back.  Patient reports that he had childhood asthma, but never had an inhaler treated it.  He has never had a problem since then until this last week.  After discussing a long list of potential new exposures, patient reports he has new laundry detergent but that he has been using it for couple of months.  Patient is not new to [place], so should be acclimated to heavy pollen load.  Patient has no exposure to chemicals recently.  He has had no aspiration recently.  No new pets, no new home environment.  Patient has no history of long trips in the car, train, on a plane.  He has no personal or family history of blood clot.  Patient does report right lower chest wall pain.  It is worse with coughing.  He also reports a sensation that he cannot take a deep breath.  He reports that BiPAP helps with that sensation, but now that he is off the BiPAP he feels like he is breathing very shallow again.  Patient reports since all this started he is also had diarrhea 3 times a day.  It is nonbloody.  Patient has no other complaints at this time. ?  ?Patient is a current smoker 1 pack-1 and half packs per day.  He does not drink alcohol, he does not use illicit drugs.  He is not vaccinated for COVID.  Patient is full code. ?   ?Chart review reveals that patient was discharged from the hospital on March 28.  The hospitalization was for acute hypoxic respiratory failure as well.  He was diagnosed with acute bronchitis.  He had no bacterial consolidation on chest x-ray, negative flu and COVID, and negative full viral panel.  He had steady improvement while in the hospital.  He was treated with empiric Zithromax x5 days, and steroids x5 days.  Nebulizer was ordered for home, but patient does not have the nebulizer at home.  Patient also had a leukocytosis at that admission which was determined to be reactive. ? ?Assessment and Plan: ?* Acute respiratory failure with hypoxia (HCC) ?-Patient in respiratory distress presentation with respiratory rate of 35 and oxygen sats down to 85% on room air at time of admission. ?-Initially requiring up to 4 L and subsequently transient use of BiPAP to stabilize respiratory distress. ?-Chest x-ray demonstrating no acute cardiopulmonary process; recent admission with completion of antibiotics as an outpatient. ?-CT angiogram limited with poor contrast but not demonstrating large embolism.  Patient reports no chest pain and is currently not having any elevated heart rate or hemodynamic instability. ?-Patient reported reactive airway disease after experiencing uncontrolled coughing spells. ?-Will continue management with the use of steroids, nebulizer, antitussive medication and twice a day PPI.  Patient also started on Pepcid nightly. ?-Continue antitussive medications. ?-  Will continue the use of Singulair. ?-Continue to wean off oxygen supplementation as tolerated. ? ?GERD (gastroesophageal reflux disease) ?-PPI twice a day has been started; will also initiate nightly Pepcid.. ? ?Class 2 obesity ?-Body mass index is 35.67 kg/m?. ?-Low calorie diet, portion control and increase physical activity discussed with patient. ? ?Tobacco abuse ?-Patient reports that he has quit smoking ?-Declining nicotine patch  currently.   ? ? ?Leukocytosis ?-Reactive most likely ?-No acute infection appreciated ?-Continue to follow WBCs trend; trending down appropriately despite the use of steroids. ?-Maintain adequate hydration. ?-Continue holding on antibiotics currently. ? ? ? ? ?Subjective:  ?No chest pain, no nausea or vomiting.  Slightly hoarse during examination and continue complaining of intermittent coughing spells that triggers reactive airway disease and shortness of breath.  Cough is nonproductive.  Patient is afebrile.  36 hours without the need of BiPAP.  Good saturation on 2 L nasal cannula supplementation. ? ?Physical Exam: ?Vitals:  ? 07/21/21 1200 07/21/21 1300 07/21/21 1400 07/21/21 1550  ?BP: 121/81 128/84 (!) 145/130   ?Pulse: (!) 102 98 (!) 115   ?Resp: (!) 21 17 13    ?Temp:    98.4 ?F (36.9 ?C)  ?TempSrc:    Oral  ?SpO2: 95% 96% 94%   ?Weight:      ?Height:      ? ?General exam: Alert, awake, oriented x 3, no chest pain, continue complaining of intermittent coughing spells especially at nighttime and early morning.  Has not required BiPAP in 36 hours; continue to feel short winded with activity and is having intermittent expiratory wheezing.  Patient is afebrile. ?Respiratory system: At time of examination mild expiratory wheezing appreciated; no using accessory muscles, no crackles.  Patient was a slightly hoarse and there is expiratory wheezing in upper airways. ?Cardiovascular system:RRR. No murmurs, rubs, gallops.  No JVD. ?Gastrointestinal system: Abdomen is nondistended, soft and nontender. No organomegaly or masses felt. Normal bowel sounds heard. ?Central nervous system: Alert and oriented. No focal neurological deficits. ?Extremities: No cyanosis or clubbing. ?Skin: No rashes, lesions or ulcers ?Psychiatry: Judgement and insight appear normal. Mood & affect appropriate.  ? ?Data Reviewed: ?CT angiogram demonstrating no frank pulmonary embolism (unfortunately poor contrast bolus). ?Procalcitonin less than  0.10 ?Negative MRSA PCR. ?Basic metabolic panel demonstrating sodium 138, potassium 3.5, bicarb 28, BUN 18 and creatinine 0.94. ?CBC with WBCs down to 15.5, hemoglobin 17, platelets count 244 K. ? ?Family Communication:  ? ?Disposition: ?Status is: Inpatient ?Remains inpatient appropriate because: Still requiring treatment for acute asthma exacerbation.  Patient with transient need of BiPAP overnight. ? ? Planned Discharge Destination: Home ? ?Author: ? , MD ?07/21/2021 5:15 PM ? ?For on call review [email or web address].  ?

## 2021-07-21 NOTE — Progress Notes (Signed)
Patient not in patient room at shift change.  Per NT, patient seen leaving unit with family and dressed.  Security notified.  Attempted to search exits and parking lot to locate patient but unable to locate.  Security and AD aware.  MD notified.  ?

## 2021-07-21 NOTE — TOC Initial Note (Signed)
Transition of Care (TOC) - Initial/Assessment Note  ? ? ?Patient Details  ?Name: Randy Newman. ?MRN: [id number] ?Date of Birth: 1981-01-07 ? ?Transition of Care (TOC) CM/SW Contact:    ?Elliot Gault, LCSW ?Phone Number: ?07/21/2021, 11:15 AM ? ?Clinical Narrative:                 ? ?Pt admitted from home. Pt was recently discharged from [facility]. During that admission pt was referred to [facility] for follow up care since pt is uninsured. [facility] can assist with care and medication assistance. Pt was also set up with a nebulizer machine under the charity care program with [employer]. Pt informs bedside RN that he did receive the nebulizer machine and medications upon dc last time. ? ?TOC will follow and assist if any new needs arise. ? ?Expected Discharge Plan: Home/Self Care ?Barriers to Discharge: Continued Medical Work up ? ? ?Patient Goals and CMS Choice ?  ?  ?  ? ?Expected Discharge Plan and Services ?Expected Discharge Plan: Home/Self Care ?  ?  ?  ?Living arrangements for the past 2 months: Single Family Home ?                ?  ?  ?  ?  ?  ?  ?  ?  ?  ?  ? ?Prior Living Arrangements/Services ?Living arrangements for the past 2 months: Single Family Home ?  ?Patient language and need for interpreter reviewed:: Yes ?Do you feel safe going back to the place where you live?: Yes      ?Need for Family Participation in Patient Care: No (Comment) ?Care giver support system in place?: No (comment) ?  ?Criminal Activity/Legal Involvement Pertinent to Current Situation/Hospitalization: No - Comment as needed ? ?Activities of Daily Living ?Home Assistive Devices/Equipment: None ?ADL Screening (condition at time of admission) ?[name] cognitive ability adequate to safely complete daily activities?: No ?Is the patient deaf or have difficulty hearing?: No ?Does the patient have difficulty seeing, even when wearing glasses/contacts?: No ?Does the patient have difficulty concentrating, remembering, or  making decisions?: No ?Patient able to express need for assistance with ADLs?: Yes ?Does the patient have difficulty dressing or bathing?: No ?Independently performs ADLs?: Yes (appropriate for developmental age) ?Does the patient have difficulty walking or climbing stairs?: No ?Weakness of Legs: Both ?Weakness of Arms/Hands: None ? ?Permission Sought/Granted ?  ?  ?   ?   ?   ?   ? ?Emotional Assessment ?  ?  ?  ?Orientation: : Oriented to Self, Oriented to Place, Oriented to  Time, Oriented to Situation ?Alcohol / Substance Use: Not Applicable ?Psych Involvement: No (comment) ? ?Admission diagnosis:  COPD exacerbation (HCC) [J44.1] ?Acute respiratory failure with hypoxia (HCC) [J96.01] ?Patient Active Problem List  ? Diagnosis Date Noted  ? GERD (gastroesophageal reflux disease) 07/20/2021  ? Acute bronchitis 07/14/2021  ? Hypokalemia 07/14/2021  ? Leukocytosis 07/14/2021  ? Tobacco abuse 07/14/2021  ? Class 2 obesity 07/14/2021  ? Acute respiratory failure with hypoxia (HCC) 07/13/2021  ? ?PCP:  Pcp, No ?Pharmacy:   ?[id number] - [place], [place] - [address] ?[address] ?[place] [address] ?Phone: [phone] Fax: [phone] ? ? ? ? ?Social Determinants of Health (SDOH) Interventions ?  ? ?Readmission Risk Interventions ? ?  07/21/2021  ? 11:14 AM  ?Readmission Risk Prevention Plan  ?Medication Screening Complete  ?Transportation Screening Complete  ? ? ? ?

## 2021-07-22 NOTE — Discharge Summary (Signed)
?Physician Discharge Summary ?  ?Patient: Randy Newman. MRN: [id number] DOB: May 12, 1980  ?Admit date:     07/18/2021  ?Discharge date: 07/21/21  ?Discharge Physician: Vassie Loll  ? ?PCP: Pcp, No  ? ?Recommendations at discharge:  ?Patient left the hospital against medical advice. ? ?Discharge Diagnoses: ?Principal Problem: ?  Acute respiratory failure with hypoxia (HCC) ?Active Problems: ?  Leukocytosis ?  Tobacco abuse ?  Class 2 obesity ?  GERD (gastroesophageal reflux disease) ? ? ?Brief hospital admission narrative course: ?As per H&P written by Dr. Carren Rang on 07/19/21 ?Randy Woolford Rajohn Henery. is a 41 y.o. male with medical history significant of with history of recent hospitalization for dyspnea, tobacco use disorder, and childhood history of asthma, presents the ED with a chief complaint of dyspnea.  Patient reports that it started 1 week ago.  He has had significant wheezing and dry cough.  He has been using his girlfriends rescue inhaler which helps, but does not resolve the symptoms.  The dyspnea comes right back.  Patient reports that he had childhood asthma, but never had an inhaler treated it.  He has never had a problem since then until this last week.  After discussing a long list of potential new exposures, patient reports he has new laundry detergent but that he has been using it for couple of months.  Patient is not new to [place], so should be acclimated to heavy pollen load.  Patient has no exposure to chemicals recently.  He has had no aspiration recently.  No new pets, no new home environment.  Patient has no history of long trips in the car, train, on a plane.  He has no personal or family history of blood clot.  Patient does report right lower chest wall pain.  It is worse with coughing.  He also reports a sensation that he cannot take a deep breath.  He reports that BiPAP helps with that sensation, but now that he is off the BiPAP he feels like he is breathing very  shallow again.  Patient reports since all this started he is also had diarrhea 3 times a day.  It is nonbloody.  Patient has no other complaints at this time. ?  ?Patient is a current smoker 1 pack-1 and half packs per day.  He does not drink alcohol, he does not use illicit drugs.  He is not vaccinated for COVID.  Patient is full code. ?  ?Chart review reveals that patient was discharged from the hospital on March 28.  The hospitalization was for acute hypoxic respiratory failure as well.  He was diagnosed with acute bronchitis.  He had no bacterial consolidation on chest x-ray, negative flu and COVID, and negative full viral panel.  He had steady improvement while in the hospital.  He was treated with empiric Zithromax x5 days, and steroids x5 days.  Nebulizer was ordered for home, but patient does not have the nebulizer at home.  Patient also had a leukocytosis at that admission which was determined to be reactive. ? ?Assessment and Plan: ?* Acute respiratory failure with hypoxia (HCC) ?-Patient in respiratory distress presentation with respiratory rate of 35 and oxygen sats down to 85% on room air at time of admission. ?-Initially requiring up to 4 L and subsequently transient use of BiPAP to stabilize respiratory distress. ?-Chest x-ray demonstrating no acute cardiopulmonary process; recent admission with completion of antibiotics as an outpatient. ?-CT angiogram limited with poor contrast but not demonstrating large embolism.  Patient reports no chest pain and is currently not having any elevated heart rate or hemodynamic instability. ?-Patient reported reactive airway disease after experiencing uncontrolled coughing spells. ?-Will continue management with the use of steroids, nebulizer, antitussive medication and twice a day PPI.  Patient also started on Pepcid nightly. ?-Continue antitussive medications. ?-Will continue the use of Singulair. ?-Continue to wean off oxygen supplementation as tolerated. ? ?GERD  (gastroesophageal reflux disease) ?-PPI twice a day has been started; will also initiate nightly Pepcid.. ? ?Class 2 obesity ?-Body mass index is 35.67 kg/m?. ?-Low calorie diet, portion control and increase physical activity discussed with patient. ? ?Tobacco abuse ?-Patient reports that he has quit smoking ?-Declining nicotine patch currently.   ? ? ?Leukocytosis ?-Reactive most likely ?-No acute infection appreciated ?-Continue to follow WBCs trend; trending down appropriately despite the use of steroids. ?-Maintain adequate hydration. ?-Continue holding on antibiotics currently. ? ? ? ? ?PATIENT LEFT AGAINST MEDICAL ADVICE.  ? ? ? ?Consultants: PULOMNOLOGY SERVICE (NEVER GOT TO SEE PATIENT AS HE LEFT AMA) ? ?DISCHARGE MEDICATION: ?Allergies as of 07/21/2021   ?No Known Allergies ?  ? ?  ?Medication List  ?  ? ?ASK your doctor about these medications   ? ?acetaminophen 500 MG tablet ?Commonly known as: TYLENOL ?Take 1,000 mg by mouth every 6 (six) hours as needed for mild pain. ?  ?albuterol (2.5 MG/3ML) 0.083% nebulizer solution ?Commonly known as: PROVENTIL ?Take 3 mLs (2.5 mg total) by nebulization every 6 (six) hours as needed for wheezing or shortness of breath. ?  ?azithromycin 250 MG tablet ?Commonly known as: ZITHROMAX ?Take 1 tablet (250 mg total) by mouth daily for 3 days. ?Ask about: Should I take this medication? ?  ?guaiFENesin 100 MG/5ML liquid ?Commonly known as: ROBITUSSIN ?Take 5 mLs by mouth every 4 (four) hours as needed for cough or to loosen phlegm. ?  ?predniSONE 20 MG tablet ?Commonly known as: DELTASONE ?Take 2 tablets (40 mg total) by mouth daily for 3 days. ?Ask about: Should I take this medication? ?  ? ?  ? ? ?Discharge Exam: ?Filed Weights  ? 07/18/21 2247 07/19/21 1145 07/21/21 0452  ?Weight: 122.5 kg 119.3 kg 119.3 kg  ? ?Condition at discharge: PATIENT LEFT AMA ? ?The results of significant diagnostics from this hospitalization (including imaging, microbiology, ancillary and  laboratory) are listed below for reference.  ? ?Imaging Studies: ?CT Angio Chest Pulmonary Embolism (PE) W or WO Contrast ? ?Result Date: 07/19/2021 ?CLINICAL DATA:  Pulmonary embolus suspected. Positive D-dimer. Acute respiratory distress and hypoxia due to asthma exacerbation. EXAM: CT ANGIOGRAPHY CHEST WITH CONTRAST TECHNIQUE: Multidetector CT imaging of the chest was performed using the standard protocol during bolus administration of intravenous contrast. Multiplanar CT image reconstructions and MIPs were obtained to evaluate the vascular anatomy. RADIATION DOSE REDUCTION: This exam was performed according to the departmental dose-optimization program which includes automated exposure control, adjustment of the mA and/or kV according to patient size and/or use of iterative reconstruction technique. CONTRAST:  9mL OMNIPAQUE IOHEXOL 350 MG/ML SOLN COMPARISON:  Chest radiograph 07/18/2021 FINDINGS: Cardiovascular: Poor contrast bolus offers extremely limited ability to evaluate the pulmonary arteries. The examination is indeterminate for possibility of pulmonary embolus. Normal caliber thoracic aorta. No aortic dissection. Great vessel origins are patent. Normal heart size. No pericardial effusions. Mediastinum/Nodes: No enlarged mediastinal, hilar, or axillary lymph nodes. Thyroid gland, trachea, and esophagus demonstrate no significant findings. Lungs/Pleura: Diffuse peribronchial thickening with minimal perihilar peribronchial infiltrates most prominent on the right. This  is consistent with airways disease. No focal consolidation. No pleural effusions. Upper Abdomen: Cholelithiasis with multiple stones in the gallbladder. No bile duct dilatation. Musculoskeletal: No chest wall abnormality. No acute or significant osseous findings. Review of the MIP images confirms the above findings. IMPRESSION: 1. Indeterminate examination for evaluation of pulmonary embolus due to poor contrast bolus. 2. No aortic aneurysm or  dissection. 3. Bronchial wall thickening with central perihilar peribronchial infiltration consistent with airways disease. 4. Cholelithiasis with multiple stones in the gallbladder. Electronically Signed

## 2021-07-23 ENCOUNTER — Other Ambulatory Visit: Payer: Self-pay

## 2021-07-23 ENCOUNTER — Emergency Department (HOSPITAL_COMMUNITY): Payer: 59

## 2021-07-23 ENCOUNTER — Inpatient Hospital Stay (HOSPITAL_COMMUNITY)
Admission: EM | Admit: 2021-07-23 | Discharge: 2021-07-26 | DRG: 202 | Disposition: A | Discharge status: Home / Self Care | Payer: 59 | Attending: Family Medicine | Admitting: Family Medicine

## 2021-07-23 DIAGNOSIS — E669 Obesity, unspecified: Secondary | ICD-10-CM | POA: Diagnosis present

## 2021-07-23 DIAGNOSIS — F1290 Cannabis use, unspecified, uncomplicated: Secondary | ICD-10-CM | POA: Diagnosis present

## 2021-07-23 DIAGNOSIS — K219 Gastro-esophageal reflux disease without esophagitis: Secondary | ICD-10-CM | POA: Diagnosis present

## 2021-07-23 DIAGNOSIS — F17290 Nicotine dependence, other tobacco product, uncomplicated: Secondary | ICD-10-CM | POA: Diagnosis present

## 2021-07-23 DIAGNOSIS — Z20822 Contact with and (suspected) exposure to covid-19: Secondary | ICD-10-CM | POA: Diagnosis present

## 2021-07-23 DIAGNOSIS — R69 Illness, unspecified: Secondary | ICD-10-CM | POA: Diagnosis not present

## 2021-07-23 DIAGNOSIS — R0603 Acute respiratory distress: Secondary | ICD-10-CM | POA: Diagnosis not present

## 2021-07-23 DIAGNOSIS — Z6836 Body mass index (BMI) 36.0-36.9, adult: Secondary | ICD-10-CM

## 2021-07-23 DIAGNOSIS — G4733 Obstructive sleep apnea (adult) (pediatric): Secondary | ICD-10-CM | POA: Diagnosis present

## 2021-07-23 DIAGNOSIS — J45901 Unspecified asthma with (acute) exacerbation: Secondary | ICD-10-CM | POA: Diagnosis present

## 2021-07-23 DIAGNOSIS — J9601 Acute respiratory failure with hypoxia: Secondary | ICD-10-CM | POA: Diagnosis present

## 2021-07-23 DIAGNOSIS — J4551 Severe persistent asthma with (acute) exacerbation: Principal | ICD-10-CM | POA: Diagnosis present

## 2021-07-23 DIAGNOSIS — R0602 Shortness of breath: Secondary | ICD-10-CM | POA: Diagnosis not present

## 2021-07-23 DIAGNOSIS — T380X5A Adverse effect of glucocorticoids and synthetic analogues, initial encounter: Secondary | ICD-10-CM | POA: Diagnosis present

## 2021-07-23 DIAGNOSIS — R000 Tachycardia, unspecified: Secondary | ICD-10-CM | POA: Diagnosis not present

## 2021-07-23 DIAGNOSIS — R918 Other nonspecific abnormal finding of lung field: Secondary | ICD-10-CM | POA: Diagnosis not present

## 2021-07-23 DIAGNOSIS — J929 Pleural plaque without asbestos: Secondary | ICD-10-CM | POA: Diagnosis not present

## 2021-07-23 DIAGNOSIS — D72829 Elevated white blood cell count, unspecified: Secondary | ICD-10-CM | POA: Diagnosis not present

## 2021-07-23 DIAGNOSIS — Z87891 Personal history of nicotine dependence: Secondary | ICD-10-CM

## 2021-07-23 DIAGNOSIS — R0609 Other forms of dyspnea: Secondary | ICD-10-CM | POA: Diagnosis not present

## 2021-07-23 LAB — CBC WITH DIFFERENTIAL/PLATELET
Abs Immature Granulocytes: 0.38 10*3/uL — ABNORMAL HIGH (ref 0.00–0.07)
Basophils Absolute: 0.3 10*3/uL — ABNORMAL HIGH (ref 0.0–0.1)
Basophils Relative: 2 %
Eosinophils Absolute: 6.1 10*3/uL — ABNORMAL HIGH (ref 0.0–0.5)
Eosinophils Relative: 35 %
HCT: 55.2 % — ABNORMAL HIGH (ref 39.0–52.0)
Hemoglobin: 19.3 g/dL — ABNORMAL HIGH (ref 13.0–17.0)
Immature Granulocytes: 2 %
Lymphocytes Relative: 19 %
Lymphs Abs: 3.2 10*3/uL (ref 0.7–4.0)
MCH: 29.5 pg (ref 26.0–34.0)
MCHC: 35 g/dL (ref 30.0–36.0)
MCV: 84.3 fL (ref 80.0–100.0)
Monocytes Absolute: 1.1 10*3/uL — ABNORMAL HIGH (ref 0.1–1.0)
Monocytes Relative: 6 %
Neutro Abs: 6.2 10*3/uL (ref 1.7–7.7)
Neutrophils Relative %: 36 %
Platelets: 268 10*3/uL (ref 150–400)
RBC: 6.55 MIL/uL — ABNORMAL HIGH (ref 4.22–5.81)
RDW: 12.8 % (ref 11.5–15.5)
WBC: 17.2 10*3/uL — ABNORMAL HIGH (ref 4.0–10.5)
nRBC: 0 % (ref 0.0–0.2)

## 2021-07-23 LAB — I-STAT ARTERIAL BLOOD GAS, ED
Acid-Base Excess: 3 mmol/L — ABNORMAL HIGH (ref 0.0–2.0)
Bicarbonate: 29 mmol/L — ABNORMAL HIGH (ref 20.0–28.0)
Calcium, Ion: 1.27 mmol/L (ref 1.15–1.40)
HCT: 53 % — ABNORMAL HIGH (ref 39.0–52.0)
Hemoglobin: 18 g/dL — ABNORMAL HIGH (ref 13.0–17.0)
O2 Saturation: 99 %
Potassium: 4.6 mmol/L (ref 3.5–5.1)
Sodium: 137 mmol/L (ref 135–145)
TCO2: 30 mmol/L (ref 22–32)
pCO2 arterial: 49.4 mmHg — ABNORMAL HIGH (ref 32–48)
pH, Arterial: 7.376 (ref 7.35–7.45)
pO2, Arterial: 152 mmHg — ABNORMAL HIGH (ref 83–108)

## 2021-07-23 LAB — RESP PANEL BY RT-PCR (FLU A&B, COVID) ARPGX2
Influenza A by PCR: NEGATIVE
Influenza B by PCR: NEGATIVE
SARS Coronavirus 2 by RT PCR: NEGATIVE

## 2021-07-23 LAB — COMPREHENSIVE METABOLIC PANEL
ALT: 83 U/L — ABNORMAL HIGH (ref 0–44)
AST: 40 U/L (ref 15–41)
Albumin: 4.1 g/dL (ref 3.5–5.0)
Alkaline Phosphatase: 59 U/L (ref 38–126)
Anion gap: 8 (ref 5–15)
BUN: 6 mg/dL (ref 6–20)
CO2: 24 mmol/L (ref 22–32)
Calcium: 9.6 mg/dL (ref 8.9–10.3)
Chloride: 106 mmol/L (ref 98–111)
Creatinine, Ser: 0.94 mg/dL (ref 0.61–1.24)
GFR, Estimated: >60 mL/min (ref >60)
Glucose, Bld: 103 mg/dL — ABNORMAL HIGH (ref 70–99)
Potassium: 4.2 mmol/L (ref 3.5–5.1)
Sodium: 138 mmol/L (ref 135–145)
Total Bilirubin: 0.5 mg/dL (ref 0.3–1.2)
Total Protein: 7.2 g/dL (ref 6.5–8.1)

## 2021-07-23 LAB — BRAIN NATRIURETIC PEPTIDE: B Natriuretic Peptide: 5.2 pg/mL (ref 0.0–100.0)

## 2021-07-23 MED ORDER — ENOXAPARIN SODIUM 30 MG/0.3ML IJ SOSY
30.0000 mg | PREFILLED_SYRINGE | INTRAMUSCULAR | Status: DC
  Administered 2021-07-23: 30 mg via SUBCUTANEOUS
  Filled 2021-07-23: qty 0.3

## 2021-07-23 MED ORDER — CHLORHEXIDINE GLUCONATE CLOTH 2 % EX PADS
6.0000 | MEDICATED_PAD | Freq: Every day | CUTANEOUS | Status: DC
  Administered 2021-07-23: 6 via TOPICAL

## 2021-07-23 MED ORDER — NITROGLYCERIN 2 % TD OINT
0.5000 [in_us] | TOPICAL_OINTMENT | Freq: Once | TRANSDERMAL | Status: AC
Start: 2021-07-23 — End: 2021-07-23
  Administered 2021-07-23: 0.5 [in_us] via TOPICAL
  Filled 2021-07-23: qty 1

## 2021-07-23 MED ORDER — ALBUTEROL (5 MG/ML) CONTINUOUS INHALATION SOLN
10.0000 mg/h | INHALATION_SOLUTION | Freq: Once | RESPIRATORY_TRACT | Status: AC
  Administered 2021-07-23: 10 mg/h via RESPIRATORY_TRACT
  Filled 2021-07-23 (×2): qty 0.5

## 2021-07-23 MED ORDER — METHYLPREDNISOLONE SODIUM SUCC 125 MG IJ SOLR
125.0000 mg | Freq: Once | INTRAMUSCULAR | Status: AC
  Administered 2021-07-23: 125 mg via INTRAVENOUS
  Filled 2021-07-23: qty 2

## 2021-07-23 MED ORDER — LORAZEPAM 2 MG/ML IJ SOLN
1.0000 mg | Freq: Once | INTRAMUSCULAR | Status: AC
  Administered 2021-07-23: 1 mg via INTRAVENOUS
  Filled 2021-07-23: qty 1

## 2021-07-23 MED ORDER — ORAL CARE MOUTH RINSE
15.0000 mL | Freq: Two times a day (BID) | OROMUCOSAL | Status: DC
  Administered 2021-07-23 – 2021-07-25 (×3): 15 mL via OROMUCOSAL

## 2021-07-23 NOTE — ED Triage Notes (Signed)
Pt arrived by POV complaining of shortness of breath for about 1 wk. Pt states he was at [facility] and they were not able to help him and he was discharged.  ? ?Pt has very fast and labored breathing with dry cough  ? ? ?

## 2021-07-23 NOTE — Progress Notes (Signed)
RT transported patient from ED to 2C17 without event. ?

## 2021-07-23 NOTE — ED Notes (Signed)
EKG taken in triage, signed off by MD. Raynelle Fanning did not transfer to pt chart.  ?

## 2021-07-23 NOTE — Progress Notes (Signed)
Called for admission by ED provider to [facility] distress with hypoxia requiring BiPAP.  Went on to discuss with the patient, he reports he has been in and out of any pain over the last several days but did not feel like he was getting better which is why he left 2 days ago.  He notes that today he became more significantly tachypneic and short of breath which is why he came to the hospital.  He reports shortness of breath that is worse with exertion and coughing.  Denies any chest pain, bowel or bladder issues, constitutional symptoms.  He reports no significant medical history other than some possible childhood asthma. ? ?Exam: ?CV: Tachycardic, no murmur appreciated ?Respiratory: Diffuse wheezes bilaterally, satting well on BiPAP, speaking in short sentences ?Abdomen: Bruising noted in areas on lower abdomen where injections were given at last hospitalization ? ?A/P: ?Respiratory Distress ?- Excepting the admission to inpatient, night team will see for full admission. ? ? ? ?Larrie Fraizer, DO  ?

## 2021-07-23 NOTE — ED Provider Notes (Signed)
Patient care coordinated with Dr. Gerhard Munch. Continuation of care from end of physician shift.  ?Physical Exam  ?BP 128/89   Pulse (!) 113   Temp 98.3 ?F (36.8 ?C) (Oral)   Resp (!) 24   Ht 6' (1.829 m)   Wt 122.5 kg   SpO2 99%   BMI 36.62 kg/m?  ? ?Physical Exam ?Constitutional:   ?   Appearance: He is obese.  ?HENT:  ?   Head: Normocephalic and atraumatic.  ?Cardiovascular:  ?   Rate and Rhythm: Regular rhythm. Tachycardia present.  ?Pulmonary:  ?   Breath sounds: Wheezing present.  ?   Comments: Currently on BiPap ?Chest:  ?   Chest wall: No tenderness.  ?Abdominal:  ?   Palpations: Abdomen is soft.  ?Musculoskeletal:  ?   Cervical back: Normal range of motion.  ?   Right lower leg: No edema.  ?   Left lower leg: No edema.  ?Skin: ?   General: Skin is warm.  ?   Comments: Diaphoretic  ?Neurological:  ?   Mental Status: He is alert.  ? ? ?Procedures  ?Procedures ? ?ED Course / MDM  ?  ?Medical Decision Making ?Amount and/or Complexity of Data Reviewed ?Labs: ordered. ?Radiology: ordered. ? ?Risk ?Prescription drug management. ?Decision regarding hospitalization. ? ? ?The patient has continued to require BiPap. Requesting consult for medical admission.  ? ? ? ? ?  ?Darrick Grinder, PA-C ?07/23/21 1837 ? ?  ?Gerhard Munch, MD ?07/23/21 2253 ? ?

## 2021-07-23 NOTE — Progress Notes (Signed)
Patient weaned off BiPAP to 3L [place].  Patient states he is feeling slightly better.  BiPAP on standby at bedside.  VSS.  RT will continue to monitor. ?

## 2021-07-23 NOTE — Plan of Care (Signed)
?  Discussed with patient plan of care for the evening, pain management and admission questions with some teach back displayed.  Will chart ED IV placed and get new one for CT per request originating from ED. ? ?Problem: Education: ?Goal: Knowledge of General Education information will improve ?Description: Including pain rating scale, medication(s)/side effects and non-pharmacologic comfort measures ?Outcome: Progressing ?  ?Problem: Activity: ?Goal: Risk for activity intolerance will decrease ?Outcome: Progressing ?  ?

## 2021-07-23 NOTE — H&P (Addendum)
Family Medicine Teaching Service ?Hospital Admission History and Physical ?Service Pager: [phone] ? ?Patient name: Randy Newman. Medical record number: [id number] ?Date of birth: [birth year] Age: 41 y.o. Gender: male ? ?Primary Care Provider: Pcp, No ?Consultants: None ?Code Status: Full  ?Preferred Emergency Contact:  ?Contact Information   ? ? Name Relation Home Work Mobile  ? Vijeo,Yanisa Significant other [phone]  [phone]  ? ?  ? ? ?Chief Complaint: Respiratory Distress  ? ?Assessment and Plan: ?Randy Meints. is a 42 y.o. male presenting with respiratory distress. PMH is significant for GERD, obesity and tobacco use. ? ?Acute hypoxic respiratory failure  ?Patient recently left AMA on 07/21/21 from [facility] for the same concern. During that admission, he was managed with BiPaP, inhaler, and steroid with antitussive medications. On arrival to the ED, he was placed on BiPAP, as well as given solumedrol, albuterol and nitroglycerine. He was afebrile with pulse 109, RR 22, sating 96%, BP 155/115. Labs were significant for an WBC 17.2, Hgb 19.3,  ALT of 83, AST 40, BNP 5.2. ABG was significant for a pCO2 49.4, pO2 152, HCO3 29.0. CXR was negative. On physical exam he was tachypneic with diffuse wheezing in all lung fields. Differential for this patient includes a PE given his cough, dyspnea, and respiratory distress, although his Well's score is 1.5 make PE unlikely. He could also be suffering from a COPD exacerbation, given his dyspnea, cough, and smoking history, but he does lack changes/increase in sputum character/volume. He could also be having an asthma exacerbation, given his dyspnea, cough, and self reported improvement of dyspnea with albuterol along with his remote childhood history of asthma and possible occupational exposure during his early years. Given euvolemic status, absence of JVD and BNP wnl, less likely new onset heart failure as he has not been  experiencing orthopnea and patient's other symptoms are not consistent. We will obtain a CTPA to rule out PE, and will also trial duonebs to see if his respiratory distress improves, suggesting a COPD exacerbation. Patient does not have a prior diagnosis of COPD but this is still a possibility, he has not seen a PCP in years and therefore has had appropriate evaluation making it possible for undiagnosed COPD. He could benefit from PFTs. Given he has had a prior hospitalization and as we are still unsure of etiology, he may benefit from pulmonology consult. He will likely benefit from outpatient pulmonology follow up as well. Will admit for respiratory failure and wean oxygen as appropriate.  ?-Admit to FPTS, attending Dr. Manson Passey ?-Consider pulm consult am  ?-CTA pending  ?-consider echo  ?-Duonebs ?-Vitals per unit ?-Telemetry ?-Continuous pulse ox ?- AM BMP, CBC ?-Continue BiPap ?-NPO while on BiPAP ?-wean O2 as tolerated ?-consider PFTs outpatient  ? ?Leukocytosis  ?Patient arrived with WBC of 17.2, up from 15.5 on 07/21/21. Patient also noted to receive steroids on admission and during last hospitalization. Unsure if this leukocytosis is 2/2 to steroid use or acute infection. Will continue to monitor ?-AM CBC ? ?GERD  ?Patient with history of GERD on chart review, not taking any medications. Will continue to monitor.  ?-Consider PPI ? ?Class 2 Obesity  ?Patient BMI 34.83. Patient may benefit from sleep study to evaluate for OSA given elevated BMI and dyspnea at rest.  ?-consider sleep study outpatient  ?-lifestyle modification in outpatient ? ?History Tobacco Use  ?Patient reports that he has been an intermittent smoker, quitting in the past, but restarting 3 years ago.  Patient quit smoking 2 weeks ago. ? ?History of cocaine and THC use  ?Patient reports remote history of THC/cocaine use.  ? ?FEN/GI: NPO while on BiPAP ?Prophylaxis: Lovenox ? ?Disposition: admit to progressive, attending Dr. Manson PasseyBrown ? ?History of  Present Illness:  Randy Krasngel Garcia Sharples Jr. is a 41 y.o. male presenting with shortness of breath. Girlfriend is at bedside as well.  ?Patient presents with dyspnea for the past 10-11 days. He is having trouble catching his breath. He went to [facility] recently with a similar concern, he was given a breathing treatment which helped. He was never given a diagnosis, he remembers getting steroids and an albuterol nebulizer which helped his symptoms. He went home after being discharge when he improved. Was home for 3 days with continued medication before having these worsening symptoms again. When he went back, they used a nebulizer which did not work so he needed BiPAP. He sometimes has coughing spells, he says when this occurs it starts these episodes of him having shortness of breath and having to catch his breath. He was recommended to find another hospital since they were not able to determine a cause for his symptoms. Denies recent viral illness. Had COVID Feb 2023. Dyspnea occurs both at rest and with activity. Cough started almost 2 weeks ago. Reports dry cough with sputum. Dyspnea is significantly affecting his activity level, even walking around in the house causing this. Endorses wheezing.  ?Denies chest pain, recent illness, sick contacts, fever and chills.  ?Former smoker quit 3 weeks ago, intermittent smoking briefly started at 41 years old and then quit for 8 years and restarted about 3 years ago. No sick contacts. 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 Occasional alcohol use in the past but now has completely quit this a few months ago. History of cocaine and marijuana use, last used 7-8 years ago. Takes no home medications. Has used albuterol in the past which helps. Had childhood asthma but was not formally treated as your symptoms were not that bad. Denies orthopnea.  ? ?Review Of Systems: Per HPI with the following  additions:  ? ?Review of Systems  ?HENT:  Negative for congestion and rhinorrhea.   ?Eyes:  Negative for visual disturbance.  ?Respiratory:  Positive for cough, shortness of breath and wheezing. Negative for chest tightness.   ?Cardiovascular:  Negative for chest pain, palpitations and leg swelling.  ?Gastrointestinal:  Negative for nausea and vomiting.  ?Neurological:  Negative for dizziness, weakness and headaches.   ? ?Patient Active Problem List  ? Diagnosis Date Noted  ? Respiratory distress 07/23/2021  ? GERD (gastroesophageal reflux disease) 07/20/2021  ? Acute bronchitis 07/14/2021  ? Hypokalemia 07/14/2021  ? Leukocytosis 07/14/2021  ? Tobacco abuse 07/14/2021  ? Class 2 obesity 07/14/2021  ? Acute respiratory failure with hypoxia (HCC) 07/13/2021  ? ? ?Past Medical History: ?Past Medical History:  ?Diagnosis Date  ? Asthma   ? ? ?Past Surgical History: ?No past surgical history on file. ? ?Social History: ?Social History  ? ?Tobacco Use  ? Smoking status: Every Day  ?  Packs/day: 1.00  ?  Years: 3.00  ?  Pack years: 3.00  ?  Types: Cigarettes  ? Smokeless tobacco: Never  ?Substance Use Topics  ? Alcohol use: Not Currently  ? Drug use: Not Currently  ? ?Additional social history:   ?Please also refer to relevant sections of EMR. ? ?Family History: ?No  family history on file. ? ?Allergies and Medications: ?No Known Allergies ?No current facility-administered medications on file prior to encounter.  ? ?Current Outpatient Medications on File Prior to Encounter  ?Medication Sig Dispense Refill  ? acetaminophen (TYLENOL) 500 MG tablet Take 1,000 mg by mouth every 6 (six) hours as needed for mild pain.    ? albuterol (PROVENTIL) (2.5 MG/3ML) 0.083% nebulizer solution Take 3 mLs (2.5 mg total) by nebulization every 6 (six) hours as needed for wheezing or shortness of breath. 75 mL 1  ? albuterol (VENTOLIN HFA) 108 (90 Base) MCG/ACT inhaler Inhale 2 puffs into the lungs every 6 (six) hours as needed for wheezing or  shortness of breath.    ? guaiFENesin (ROBITUSSIN) 100 MG/5ML liquid Take 5 mLs by mouth every 4 (four) hours as needed for cough or to loosen phlegm.    ? ? ?Objective: ?BP (!) 154/126   Pulse (!) 113   Temp 98.3 ?

## 2021-07-23 NOTE — ED Provider Notes (Signed)
?[facility] EMERGENCY DEPARTMENT ?Provider Note ? ? ?CSN: [id number] ?Arrival date & time: 07/23/21  1505 ? ?  ? ?History ? ?Chief Complaint  ?Patient presents with  ? Shortness of Breath  ? ? ?Randy Nudelman Giuliano Preece. is a 41 y.o. male. ? ?HPI ?Presents in respiratory distress.  He is here with his wife, who assists with the history.  Patient was recently hospitalized, left AGAINST MEDICAL ADVICE 2 days ago from our affiliated facility.  That was his second admission for ongoing dyspnea.  She notes a history of childhood asthma, but no known history of adult pulmonary disease, no cardiac disease.  After leaving he has been progressively more dyspneic, uncomfortable, and today presents due to these concerns.  He takes no medication regularly, smokes cigarettes. ? ?Home Medications ?Prior to Admission medications   ?Medication Sig Start Date End Date Taking? Authorizing Provider  ?acetaminophen (TYLENOL) 500 MG tablet Take 1,000 mg by mouth every 6 (six) hours as needed for mild pain.    [provider]  ?albuterol (PROVENTIL) (2.5 MG/3ML) 0.083% nebulizer solution Take 3 mLs (2.5 mg total) by nebulization every 6 (six) hours as needed for wheezing or shortness of breath. 07/15/21   Tyrone Nine, MD  ?guaiFENesin (ROBITUSSIN) 100 MG/5ML liquid Take 5 mLs by mouth every 4 (four) hours as needed for cough or to loosen phlegm.    [provider]  ?   ? ?Allergies    ?Patient has no known allergies.   ? ?Review of Systems   ?Review of Systems  ?Constitutional:   ?     Per HPI, otherwise negative  ?HENT:    ?     Per HPI, otherwise negative  ?Respiratory:    ?     Per HPI, otherwise negative  ?Cardiovascular:   ?     Per HPI, otherwise negative  ?Gastrointestinal:  Negative for vomiting.  ?Endocrine:  ?     Negative aside from HPI  ?Genitourinary:   ?     Neg aside from HPI   ?Musculoskeletal:   ?     Per HPI, otherwise negative  ?Skin: Negative.   ?Neurological:  Negative for syncope.   ? ?Physical Exam ?Updated Vital Signs ?BP (!) 155/115 (BP Location: Left Arm)   Pulse (!) 109   Temp 98.3 ?F (36.8 ?C) (Oral)   Resp (!) 22   Ht 6' (1.829 m)   Wt 122.5 kg   SpO2 96%   BMI 36.62 kg/m?  ?Physical Exam ?Vitals and nursing note reviewed.  ?Constitutional:   ?   General: He is not in acute distress. ?   Appearance: He is well-developed.  ?HENT:  ?   Head: Normocephalic and atraumatic.  ?Eyes:  ?   Conjunctiva/sclera: Conjunctivae normal.  ?Cardiovascular:  ?   Rate and Rhythm: Regular rhythm. Tachycardia present.  ?Pulmonary:  ?   Effort: Tachypnea, accessory muscle usage and respiratory distress present.  ?   Breath sounds: Stridor present. Decreased breath sounds and wheezing present.  ?Abdominal:  ?   General: There is no distension.  ?Skin: ?   General: Skin is warm and dry.  ?Neurological:  ?   Mental Status: He is alert and oriented to person, place, and time.  ? ? ?ED Results / Procedures / Treatments   ?Labs ?(all labs ordered are listed, but only abnormal results are displayed) ?Labs Reviewed - No data to display ? ?EKG ?None ? ?Radiology ?No results found. ? ?Procedures ?  Procedures  ? ? ?Medications Ordered in ED ?Medications - No data to display ? ?ED Course/ Medical Decision Making/ A&P ?This patient with a Hx of 2 recent admissions for respiratory compromise, ongoing smoking, obesity presents to the ED for concern of respiratory distress, this involves an extensive number of treatment options, and is a complaint that carries with it a high risk of complications and morbidity.   ? ?The differential diagnosis includes hypertensive crisis, pulmonary congestion, pneumonia, bronchitis, respiratory failure ? ? ?Social Determinants of Health: ? ?Obesity, cigarette smoking ? ?Additional history obtained: ? ?Additional history and/or information obtained from wife, chart review, notable for wife for history, chart review for discharge summary from 2 days ago after he left from the ICU at our  affiliated facility AGAINST MEDICAL ADVICE ? ? ?After the initial evaluation, orders, including: Labs, BiPAP, bronchodilators, nitroglycerin were initiated. ? ? ?Patient placed on Cardiac and Pulse-Oximetry Monitors. ?The patient was maintained on a cardiac monitor.  The cardiac monitored showed an rhythm of sinus tach 115 abnormal ? ?The patient was also maintained on pulse oximetry. The readings were typically 94% ? ? ?On repeat evaluation of the patient stayed the same ? ?3:45 PM ?Patient required Ativan for tolerance of BiPAP. ? ?Dispostion / Final MDM: ? ?After consideration of the diagnostic results and the patient's response to treatment, receiving ongoing therapy, BiPAP, bronchodilators, nitroglycerin, labs pending, x-ray pending, admission anticipated for respiratory distress, likely multifactorial.  Labs pending ? ?PA Marcelline Deist is coordinating care with me of the patient. ? ? ?Final Clinical Impression(s) / ED Diagnoses ?Final diagnoses:  ?Respiratory distress  ? ?CRITICAL CARE ?Performed by: Gerhard Munch ?Total critical care time: 35 minutes ?Critical care time was exclusive of separately billable procedures and treating other patients. ?Critical care was necessary to treat or prevent imminent or life-threatening deterioration. ?Critical care was time spent personally by me on the following activities: development of treatment plan with patient and/or surrogate as well as nursing, discussions with consultants, evaluation of patient's response to treatment, examination of patient, obtaining history from patient or surrogate, ordering and performing treatments and interventions, ordering and review of laboratory studies, ordering and review of radiographic studies, pulse oximetry and re-evaluation of patient's condition. ? ?  ?Gerhard Munch, MD ?07/23/21 1548 ? ?

## 2021-07-23 NOTE — ED Provider Triage Note (Signed)
Emergency Medicine Provider Triage Evaluation Note ? ?Calton Dach Renold Don. , a 41 y.o. male  was evaluated in triage.  Pt complains of shortness of breath x 1 week.  Patient was recently admitted to [facility], where he left yesterday as he reports he was not getting any answers.  He was not getting any better.  He reports continued shortness of breath, arrived to the triage room diaphoretic, tachypneic but maintaining his oxygen are 96%.  Last tobacco use was approximately 2 weeks ago. ? ?Review of Systems  ?Positive: Diaphoretic, shortness of breath, cough, chest pain ?Negative: Leg swelling, abdominal pain, nausea ? ?Physical Exam  ?BP (!) 155/115 (BP Location: Left Arm)   Pulse (!) 109   Temp 98.3 ?F (36.8 ?C) (Oral)   Resp (!) 22   SpO2 96%  ?Gen:   Awake, in distress, diaphoretic ?Resp:  Tachypnea ?MSK:   Moves extremities without difficulty ?Other:   ? ?Medical Decision Making  ?Medically screening exam initiated at 3:13 PM.  Appropriate orders placed.  Shogo Larkey Renold Don. was informed that the remainder of the evaluation will be completed by another provider, this initial triage assessment does not replace that evaluation, and the importance of remaining in the ED until their evaluation is complete. ? ?Patient appears very uncomfortable in triage, struggling to breathe called charge RN to obtain room. ?  Claude Manges, PA-C ?07/23/21 1525 ? ?

## 2021-07-24 ENCOUNTER — Encounter (HOSPITAL_COMMUNITY): Payer: Self-pay | Admitting: Family Medicine

## 2021-07-24 ENCOUNTER — Inpatient Hospital Stay (HOSPITAL_COMMUNITY): Payer: 59

## 2021-07-24 DIAGNOSIS — J9601 Acute respiratory failure with hypoxia: Secondary | ICD-10-CM | POA: Diagnosis not present

## 2021-07-24 DIAGNOSIS — R0609 Other forms of dyspnea: Secondary | ICD-10-CM

## 2021-07-24 DIAGNOSIS — R0603 Acute respiratory distress: Secondary | ICD-10-CM | POA: Diagnosis not present

## 2021-07-24 DIAGNOSIS — F1290 Cannabis use, unspecified, uncomplicated: Secondary | ICD-10-CM | POA: Diagnosis present

## 2021-07-24 DIAGNOSIS — Z87891 Personal history of nicotine dependence: Secondary | ICD-10-CM

## 2021-07-24 DIAGNOSIS — J4551 Severe persistent asthma with (acute) exacerbation: Secondary | ICD-10-CM | POA: Diagnosis not present

## 2021-07-24 LAB — RESPIRATORY PANEL BY PCR

## 2021-07-24 LAB — ECHOCARDIOGRAM COMPLETE BUBBLE STUDY
AR max vel: 2.99 cm2
AV Area VTI: 3.18 cm2
AV Area mean vel: 2.96 cm2
AV Mean grad: 6 mmHg
AV Peak grad: 10.5 mmHg
Ao pk vel: 1.62 m/s
Area-P 1/2: 2.52 cm2
Height: 72 in
S' Lateral: 2.7 cm
Weight: 4109.37 oz

## 2021-07-24 LAB — BASIC METABOLIC PANEL
Anion gap: 12 (ref 5–15)
BUN: 11 mg/dL (ref 6–20)
CO2: 24 mmol/L (ref 22–32)
Calcium: 9.4 mg/dL (ref 8.9–10.3)
Chloride: 101 mmol/L (ref 98–111)
Creatinine, Ser: 0.99 mg/dL (ref 0.61–1.24)
GFR, Estimated: >60 mL/min (ref >60)
Glucose, Bld: 127 mg/dL — ABNORMAL HIGH (ref 70–99)
Potassium: 4.6 mmol/L (ref 3.5–5.1)
Sodium: 137 mmol/L (ref 135–145)

## 2021-07-24 LAB — CBC
HCT: 52.7 % — ABNORMAL HIGH (ref 39.0–52.0)
Hemoglobin: 18.2 g/dL — ABNORMAL HIGH (ref 13.0–17.0)
MCH: 28.9 pg (ref 26.0–34.0)
MCHC: 34.5 g/dL (ref 30.0–36.0)
MCV: 83.7 fL (ref 80.0–100.0)
Platelets: 244 10*3/uL (ref 150–400)
RBC: 6.3 MIL/uL — ABNORMAL HIGH (ref 4.22–5.81)
RDW: 12.8 % (ref 11.5–15.5)
WBC: 14.6 10*3/uL — ABNORMAL HIGH (ref 4.0–10.5)
nRBC: 0 % (ref 0.0–0.2)

## 2021-07-24 LAB — RAPID URINE DRUG SCREEN, HOSP PERFORMED
Amphetamines: NOT DETECTED
Barbiturates: NOT DETECTED
Benzodiazepines: NOT DETECTED
Cocaine: NOT DETECTED
Opiates: NOT DETECTED
Tetrahydrocannabinol: POSITIVE — AB

## 2021-07-24 LAB — MRSA NEXT GEN BY PCR, NASAL: MRSA by PCR Next Gen: NOT DETECTED

## 2021-07-24 MED ORDER — ENOXAPARIN SODIUM 40 MG/0.4ML IJ SOSY: 40.0000 mg | PREFILLED_SYRINGE | INTRAMUSCULAR | Status: DC

## 2021-07-24 MED ORDER — METHYLPREDNISOLONE SODIUM SUCC 125 MG IJ SOLR
120.0000 mg | Freq: Two times a day (BID) | INTRAMUSCULAR | Status: DC
  Administered 2021-07-24 – 2021-07-25 (×3): 120 mg via INTRAVENOUS
  Filled 2021-07-24 (×3): qty 2

## 2021-07-24 MED ORDER — IOHEXOL 350 MG/ML SOLN
62.0000 mL | Freq: Once | INTRAVENOUS | Status: AC | PRN
Start: 2021-07-24 — End: 2021-07-24
  Administered 2021-07-24: 62 mL via INTRAVENOUS

## 2021-07-24 MED ORDER — ARFORMOTEROL TARTRATE 15 MCG/2ML IN NEBU
15.0000 ug | INHALATION_SOLUTION | Freq: Two times a day (BID) | RESPIRATORY_TRACT | Status: DC
  Administered 2021-07-24 – 2021-07-26 (×5): 15 ug via RESPIRATORY_TRACT
  Filled 2021-07-24 (×5): qty 2

## 2021-07-24 MED ORDER — BUDESONIDE 0.5 MG/2ML IN SUSP
0.5000 mg | Freq: Two times a day (BID) | RESPIRATORY_TRACT | Status: DC
  Administered 2021-07-24 – 2021-07-26 (×5): 0.5 mg via RESPIRATORY_TRACT
  Filled 2021-07-24 (×5): qty 2

## 2021-07-24 MED ORDER — IPRATROPIUM-ALBUTEROL 0.5-2.5 (3) MG/3ML IN SOLN: 3.0000 mL | RESPIRATORY_TRACT | Status: DC | PRN

## 2021-07-24 MED ORDER — GUAIFENESIN-DM 100-10 MG/5ML PO SYRP
5.0000 mL | ORAL_SOLUTION | ORAL | Status: DC | PRN
  Administered 2021-07-24 – 2021-07-25 (×5): 5 mL via ORAL
  Filled 2021-07-24 (×5): qty 5

## 2021-07-24 MED ORDER — IPRATROPIUM-ALBUTEROL 0.5-2.5 (3) MG/3ML IN SOLN
3.0000 mL | RESPIRATORY_TRACT | Status: DC
  Administered 2021-07-24 – 2021-07-25 (×8): 3 mL via RESPIRATORY_TRACT
  Filled 2021-07-24 (×8): qty 3

## 2021-07-24 MED ORDER — RIVAROXABAN 10 MG PO TABS
10.0000 mg | ORAL_TABLET | Freq: Every day | ORAL | Status: DC
  Administered 2021-07-24 – 2021-07-26 (×3): 10 mg via ORAL
  Filled 2021-07-24 (×3): qty 1

## 2021-07-24 MED ORDER — MONTELUKAST SODIUM 10 MG PO TABS
10.0000 mg | ORAL_TABLET | Freq: Every day | ORAL | Status: DC
  Administered 2021-07-24 – 2021-07-25 (×2): 10 mg via ORAL
  Filled 2021-07-24 (×2): qty 1

## 2021-07-24 NOTE — Progress Notes (Signed)
?  Echocardiogram ?2D Echocardiogram has been performed. ? ?Randy Newman ?07/24/2021, 5:33 PM ?

## 2021-07-24 NOTE — Hospital Course (Addendum)
Randy Newman. is a 41 y.o.male with a history of GERD, obesity, tobacco use, vaping, remote insecticide/pesticide use who was admitted to the [facility] Teaching Service at [facility] for acute respiratory distress. His hospital course is detailed below: ? ?Asthma Exacerbation ?Acute Hypoxic Respiratory failure  ?Patient was admitted due to shortness of breath and has been admitted 2 times recently due to similar complaints.  Patient initially required BiPAP in the ED but was transitioned back to nasal cannula.  Pulmonology was consulted and they suggested that this was a severe asthma exacerbation.  Patient was started on high-dose steroids of IV Solu-Medrol 240 mg/day, Pulmicort and Brovana twice daily, and Singulair daily.  Patient does have a tobacco use history as well as vaping use history.  Patient was tapered and transition to oral steroids and discharged with a steroid taper. Pulmonology to follow-up outpatient. ? ?Other chronic conditions were medically managed with home medications and formulary alternatives as necessary (tobacco use, GERD, obesity) ? ?PCP Follow-up Recommendations: ?Outpatient PFT's ?Continue to counsel patient on tobacco, marijuana and vaping avoidance. ?Order outpatient sleep studies to assess for sleep apnea  ?Follow up IgE and Legionella results ?

## 2021-07-24 NOTE — Progress Notes (Addendum)
Family Medicine Teaching Service ?Daily Progress Note ?Intern Pager: [phone] ? ?Patient name: Randy Newman. Medical record number: [id number] ?Date of birth: Jun 25, 1980 Age: 41 y.o. Gender: male ? ?Primary Care Provider: Pcp, No ?Consultants: None ?Code Status: Full ? ?Pt Overview and Major Events to Date:  ?07/23/21 admitted ? ?Assessment and Plan: ?Randy Newman is a 41 yo male presenting w/ respiratory distress likely asthma exacerbation. PMHx significant for GERD, obesity, and tobacco use ? ?Acute hypoxic respiratory failure  ?Asthma Exacerbation ?Pulmonology suspect asthma exacerbation. On Bipap overnight for 4 hours per overnight pulse ox (O2 sat 96). Back on room air (O2 sat 90%). Resp panel negative. Off droplet precaution ?-Pulmonology following, appreciate recommendation ?-IV solumedrol 120 mg q12 hours ?-Continue pulmicort and brovana BID ?-Continue Singulair qd ?-Echo EF >75%, hyperdynamic function with LV Grade I diastolic dysfunction, no shunt ?-Continuous pulse-ox ?-Outpatient PFTs ?-Legionella pending ? ?Leukocytosis ?WBC likely secondary to IV steroid use from this hospitalization and very recent hospitalization. Afebrile and dry cough. Unlikely to be infection at this time. ?-Continue to monitor for signs of infection ? ?Hx of GERD ?Not on PPI but will consider adding on discharge ? ?Class 2 Obesity ?BMI 34.83 ?-Outpatient sleep study ?-Outpatient lifestyle modification ? ?Tobacco use ?Quit smoking 2 weeks ago but smoked 1 PPD for 3 years and has had intermittent ? ?Concern for Obstructive Sleep Apnea ?STOP-BANG 4 ?-Outpatient sleep study. ? 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 UDS positive for THC. ?-Encourage cessation ? ?FEN/GI: Regular ?PPx: Xarelto ?Dispo:Home tomorrow. Barriers include oxygen requirement and ongoing medical treatment. ? ?Subjective:  ?Girlfriend at bedside. Patient seen resting comfortably. No acute complaints. Reports breathing  better.  ? ? ?Objective: ?Temp:  [97.7 ?F (36.5 ?C)-98.7 ?F (37.1 ?C)] 98.7 ?F (37.1 ?C) (04/06 1300) ?Pulse Rate:  [85-122] 103 (04/06 1800) ?Resp:  [16-33] 27 (04/06 1800) ?BP: (117-149)/(79-98) 130/98 (04/06 0805) ?SpO2:  [89 %-98 %] 90 % (04/06 1800) ?Weight:  [256 lb 13.4 oz (116.5 kg)] 256 lb 13.4 oz (116.5 kg) (04/05 2245) ?Physical Exam: ?General: Resting comfortably on room air ?Cardiovascular: RRR ?Respiratory: Able to inspire deeper. Improved breathing ?Abdomen: soft, nontender ?Extremities: able to move all extremities ? ?Laboratory: ?Recent Labs  ?Lab 07/21/21 ?0144 07/23/21 ?1600 07/23/21 ?1858 07/24/21 ?0034  ?WBC 15.5* 17.2*  --  14.6*  ?HGB 17.0 19.3* 18.0* 18.2*  ?HCT 52.1* 55.2* 53.0* 52.7*  ?PLT 244 268  --  244  ? ? ?Recent Labs  ?Lab 07/18/21 ?2307 07/19/21 ?0618 07/21/21 ?0144 07/23/21 ?1600 07/23/21 ?1858 07/24/21 ?0034  ?NA 139 138 138 138 137 137  ?K 3.7 4.5 3.5 4.2 4.6 4.6  ?CL 102 101 100 106  --  101  ?CO2 27 27 28 24   --  24  ?BUN 8 10 18 6   --  11  ?CREATININE 0.83 0.84 0.94 0.94  --  0.99  ?CALCIUM 9.3 9.1 8.9 9.6  --  9.4  ?PROT 7.8 7.9  --  7.2  --   --   ?BILITOT 0.6 0.6  --  0.5  --   --   ?ALKPHOS 66 68  --  59  --   --   ?ALT 54* 51*  --  83*  --   --   ?AST 46* 36  --  40  --   --   ?GLUCOSE 111* 153* 98 103*  --  127*  ? ? ? ? ? ?Imaging/Diagnostic Tests: ?CT Angio Chest  Pulmonary Embolism (PE) W or WO Contrast ? ?Result Date: 07/24/2021 ?CLINICAL DATA:  Chest pain or shortness of breath with pleurisy or effusion suspected. Shortness of breath for 1 week. Dry cough. EXAM: CT ANGIOGRAPHY CHEST WITH CONTRAST TECHNIQUE: Multidetector CT imaging of the chest was performed using the standard protocol during bolus administration of intravenous contrast. Multiplanar CT image reconstructions and MIPs were obtained to evaluate the vascular anatomy. RADIATION DOSE REDUCTION: This exam was performed according to the departmental dose-optimization program which includes automated exposure  control, adjustment of the mA and/or kV according to patient size and/or use of iterative reconstruction technique. CONTRAST:  16mL OMNIPAQUE IOHEXOL 350 MG/ML SOLN COMPARISON:  07/19/2021 FINDINGS: Cardiovascular: Normal heart size. No pericardial effusions. Normal caliber thoracic aorta. Great vessel origins are patent. Contrast bolus is somewhat limited, precluding evaluation of the distal segmental and subsegmental pulmonary arteries. The central pulmonary arteries are opacified without evidence of large central pulmonary embolus. Mediastinum/Nodes: No enlarged mediastinal, hilar, or axillary lymph nodes. Thyroid gland, trachea, and esophagus demonstrate no significant findings. Lungs/Pleura: Motion artifact limits examination. Peribronchial thickening suggesting airways disease. No consolidation or edema. No pleural effusions. Upper Abdomen: Cholelithiasis versus gallbladder sludge. Musculoskeletal: No chest wall abnormality. No acute or significant osseous findings. Review of the MIP images confirms the above findings. IMPRESSION: 1. No evidence of central pulmonary embolus. More distal vessels are not well opacified and are unable to be evaluated. 2. Peribronchial thickening suggesting airways disease. No focal consolidation. 3. Cholelithiasis or gallbladder sludge. Electronically Signed   By: Lucienne Capers M.D.   On: 07/24/2021 03:21  ? ?ECHOCARDIOGRAM COMPLETE BUBBLE STUDY ? ?Result Date: 07/24/2021 ?   ECHOCARDIOGRAM REPORT   Patient Name:   Randy Newman. Date of Exam: 07/24/2021 Medical Rec #:  [id number]                  Height:       72.0 in Accession #:    [id number]                 Weight:       256.8 lb Date of Birth:  1980-05-17                  BSA:          2.370 m? Patient Age:    41 years                   BP:           133/91 mmHg Patient Gender: M                          HR:           98 bpm. Exam Location:  Inpatient Procedure: 2D Echo, Cardiac Doppler, Color Doppler and Saline  Contrast Bubble            Study Indications:    Dyspnea  History:        Patient has no prior history of Echocardiogram examinations.                 Risk Factors:Current Smoker. GERD. Acute respiratory failure                 with hypoxia.  Sonographer:    Clayton Lefort RDCS (AE) Referring Phys: [id number] CARINA M BROWN IMPRESSIONS  1. Left ventricular ejection fraction, by estimation, is >75%. The left ventricle has hyperdynamic function. The  left ventricle has no regional wall motion abnormalities. There is moderate left ventricular hypertrophy. Left ventricular diastolic parameters are consistent with Grade I diastolic dysfunction (impaired relaxation).  2. Right ventricular systolic function is hyperdynamic. The right ventricular size is normal. Tricuspid regurgitation signal is inadequate for assessing PA pressure.  3. The mitral valve is grossly normal. No evidence of mitral valve regurgitation.  4. The aortic valve is tricuspid. Aortic valve regurgitation is not visualized.  5. The inferior vena cava is normal in size with greater than 50% respiratory variability, suggesting right atrial pressure of 3 mmHg.  6. Agitated saline contrast bubble study was negative, with no evidence of any interatrial shunt. Comparison(s): No prior Echocardiogram. FINDINGS  Left Ventricle: Left ventricular ejection fraction, by estimation, is >75%. The left ventricle has hyperdynamic function. The left ventricle has no regional wall motion abnormalities. The left ventricular internal cavity size was normal in size. There is moderate left ventricular hypertrophy. Left ventricular diastolic parameters are consistent with Grade I diastolic dysfunction (impaired relaxation). Normal left ventricular filling pressure. Right Ventricle: The right ventricular size is normal. No increase in right ventricular wall thickness. Right ventricular systolic function is hyperdynamic. Tricuspid regurgitation signal is inadequate for assessing PA  pressure. Left Atrium: Left atrial size was normal in size. Right Atrium: Right atrial size was normal in size. Pericardium: There is no evidence of pericardial effusion. Mitral Valve: The mitral valve is gross

## 2021-07-24 NOTE — Progress Notes (Addendum)
Family Medicine Teaching Service ?Daily Progress Note ?Intern Pager: [phone] ? ?Patient name: Randy Newman. Medical record number: [id number] ?Date of birth: [birth year] Age: 41 y.o. Gender: male ? ?Primary Care Provider: Pcp, No ?Consultants: None ?Code Status: Full ? ?Pt Overview and Major Events to Date:  ?07/23/21 admitted ? ?Assessment and Plan: ?Randy Newman is a 41 yo male presenting w/ respiratory distress. PMHx significant for GERD, obesity, and tobacco use ? ?Acute hypoxic respiratory failure  ?COPD exacerbation vs Acute Bronchitis ?Patient down to 3L [place] with transient use of BiPAP throughout night. Baseline he is on room air. Vitals have somewhat improved but remains somewhat tachycardic at times. CTPE did not show evidence of central PE, although distal vessels were not well visualized to evaluate. Did show evidence of peribronchial thickening suggesting airway disease without focal consolidation. Continues to remain tachycardic.  ?-Pulmonology Consult, appreciate recommendation ?-Scheduled duonebs q4 hours ?-Echo pending ?-Continuous pulse-ox ?-on 3 L [place], wean when able ?-Outpatient PFTs ?-Legionella pending ? ?Leukocytosis  ?WBC 17.2>14.6. likely secondary to IV steroid use from this hospitalization and very recent hospitalization. Afebrile and dry cough. Unlikely to be infection at this time. ?-AM CBC ? ?Hx of GERD ?Not on PPI but will consider adding on discharge ? ?Class 2 Obesity ?BMI 34.83 ?-Outpatient sleep study ?-Outpatient lifestyle modification ? ?Tobacco use ?Quit smoking 2 weeks ago but smoked 1 PPD for 3 years and has had intermittent ? ?Cocaine and THC use ?Reports he quit 7-8 years ago. ? ?FEN/GI: Regular ?PPx: Xarelto ?Dispo:Home tomorrow. Barriers include oxygen requirement and ongoing medical treatment.  ? ?Subjective:  ?Patient seen and assessed at bedside.  Patient reported feeling very frustrated that he had no clear answers.  Discussed with patient likelihood  that this shortness of breath is secondary to his smoking history.  Patient appeared much more calm after receiving some answers.  Patient reported he would leave AMA if he was able to eat as he had not eaten for approximately 24 hours.  [name] needs have been met and all questions have been addressed. Complains of too many subcutaneous lovenox injection so was switched to xarelto ? ?Objective: ?Temp:  [98.1 ?F (36.7 ?C)-98.3 ?F (36.8 ?C)] 98.3 ?F (36.8 ?C) (04/06 0310) ?Pulse Rate:  [96-129] 100 (04/06 0500) ?Resp:  [16-31] 20 (04/06 0500) ?BP: (117-160)/(76-126) 140/86 (04/06 0310) ?SpO2:  [91 %-100 %] 96 % (04/06 0500) ?FiO2 (%):  [45 %] 45 % (04/05 1647) ?Weight:  [116.5 kg-122.5 kg] 116.5 kg (04/05 2245) ?Physical Exam: ?General: Resting comfortably on 3 L nasal cannula ?Cardiovascular: RRR ?Respiratory: Inspiratory wheezing heard in all 4 quadrants.  Had difficulty taking deep breaths ?Abdomen: soft, nontender ?Extremities: able to move all extremities ? ?Laboratory: ?Recent Labs  ?Lab 07/21/21 ?0144 07/23/21 ?1600 07/23/21 ?1858 07/24/21 ?0034  ?WBC 15.5* 17.2*  --  14.6*  ?HGB 17.0 19.3* 18.0* 18.2*  ?HCT 52.1* 55.2* 53.0* 52.7*  ?PLT 244 268  --  244  ? ?Recent Labs  ?Lab 07/18/21 ?2307 07/19/21 ?0618 07/21/21 ?0144 07/23/21 ?1600 07/23/21 ?1858 07/24/21 ?0034  ?NA 139 138 138 138 137 137  ?K 3.7 4.5 3.5 4.2 4.6 4.6  ?CL 102 101 100 106  --  101  ?CO2 '27 27 28 24  ' --  24  ?BUN '8 10 18 6  ' --  11  ?CREATININE 0.83 0.84 0.94 0.94  --  0.99  ?CALCIUM 9.3 9.1 8.9 9.6  --  9.4  ?PROT 7.8 7.9  --  7.2  --   --   ?  BILITOT 0.6 0.6  --  0.5  --   --   ?ALKPHOS 66 68  --  59  --   --   ?ALT 54* 51*  --  83*  --   --   ?AST 46* 36  --  40  --   --   ?GLUCOSE 111* 153* 98 103*  --  127*  ? ? ? ? ?Imaging/Diagnostic Tests: ?CT Angio Chest Pulmonary Embolism (PE) W or WO Contrast ? ?Result Date: 07/24/2021 ?CLINICAL DATA:  Chest pain or shortness of breath with pleurisy or effusion suspected. Shortness of breath for 1  week. Dry cough. EXAM: CT ANGIOGRAPHY CHEST WITH CONTRAST TECHNIQUE: Multidetector CT imaging of the chest was performed using the standard protocol during bolus administration of intravenous contrast. Multiplanar CT image reconstructions and MIPs were obtained to evaluate the vascular anatomy. RADIATION DOSE REDUCTION: This exam was performed according to the departmental dose-optimization program which includes automated exposure control, adjustment of the mA and/or kV according to patient size and/or use of iterative reconstruction technique. CONTRAST:  78m OMNIPAQUE IOHEXOL 350 MG/ML SOLN COMPARISON:  07/19/2021 FINDINGS: Cardiovascular: Normal heart size. No pericardial effusions. Normal caliber thoracic aorta. Great vessel origins are patent. Contrast bolus is somewhat limited, precluding evaluation of the distal segmental and subsegmental pulmonary arteries. The central pulmonary arteries are opacified without evidence of large central pulmonary embolus. Mediastinum/Nodes: No enlarged mediastinal, hilar, or axillary lymph nodes. Thyroid gland, trachea, and esophagus demonstrate no significant findings. Lungs/Pleura: Motion artifact limits examination. Peribronchial thickening suggesting airways disease. No consolidation or edema. No pleural effusions. Upper Abdomen: Cholelithiasis versus gallbladder sludge. Musculoskeletal: No chest wall abnormality. No acute or significant osseous findings. Review of the MIP images confirms the above findings. IMPRESSION: 1. No evidence of central pulmonary embolus. More distal vessels are not well opacified and are unable to be evaluated. 2. Peribronchial thickening suggesting airways disease. No focal consolidation. 3. Cholelithiasis or gallbladder sludge. Electronically Signed   By: WLucienne CapersM.D.   On: 07/24/2021 03:21  ? ?DG Chest Port 1 View ? ?Result Date: 07/23/2021 ?CLINICAL DATA:  Shortness of breath. EXAM: PORTABLE CHEST 1 VIEW COMPARISON:  07/18/2021  FINDINGS: The cardiac silhouette, mediastinal and hilar contours are within normal limits and stable. The lungs are clear. No pleural effusion or pulmonary lesions. The bony thorax is intact. IMPRESSION: No acute cardiopulmonary findings. Electronically Signed   By: PMarijo SanesM.D.   On: 07/23/2021 16:25   ? ?JFrance Ravens MD ?07/24/2021, 5:31 AM ?PGY-1, [place]?FWhittemoreIntern pager: [phone] text pages welcome ?

## 2021-07-24 NOTE — Progress Notes (Signed)
FPTS Brief Progress Note ? ?S: Patient seen and assessed at bedside at 0730. Required in person evaluation given BiPAP was on last night prior to resuming diet. Patient was resting comfortably at bedside on 3 L [place]. He requested diet since he had not eaten since 9 AM yesterday. Patient is frustrated by not having answers and not having diet.  ? ? ?O: ?BP 140/86   Pulse 92   Temp 98.3 ?F (36.8 ?C) (Oral)   Resp 20   Ht 6' (1.829 m)   Wt 116.5 kg   SpO2 92%   BMI 34.83 kg/m?   ? Patient was switched to 3L [place] at 0630 per chart review. ? ?A/P: ?- Regular diet added ?- Scheduled duonebs q4 hours ?- Pulmonology consult ?- Remainder of plan in later note ?- Orders reviewed. Labs for AM ordered, which was adjusted as needed.  ?- If condition changes, plan includes bedside evaluation.  ? ?France Ravens, MD ?07/24/2021, 8:15 AM ?PGY-1, [facility] Night Resident  ?Please page [phone] with questions.  ? ?

## 2021-07-24 NOTE — Plan of Care (Signed)

## 2021-07-24 NOTE — Progress Notes (Addendum)
FPTS Brief Progress Note ? ?S:Went to see patient with Dr. Robyne Peers, he was resting comfortably in room. Spoke with patient about his respiratory status. His breathing has been improved over the day. He plans to go on CPAP tonight after his labs are collected around 12 am. ? ? ?O: ?Telemetry reviewed and notable for: Pulse 102, Resp 17, Sat 92, on room air.  ? ?General: Well appearing, NAD, cooperative, conversational ?Resp: Good aeration diffusely in all lung fields and good work of breathing. No wheezes or crackles ?Card: RRR, NR[facility] ? ?A/P: ?Patient appears to be improving. Etiology of respiratory distress still unclear, but thought to be related to asthma symptoms at this time. Will continue to monitor respiratory status. Patient sating well on room air.  ?- Patient to wear CPAP/BiPAP tonight night from at least from 12-4 am.  ?- Orders reviewed. Labs for AM ordered, which was adjusted as needed.  ?- Plans per day team ? ?Bess Kinds, MD ?07/24/2021, 11:47 PM ?PGY-1, [facility] Night Resident  ?Please page [phone] with questions.  ? ? ?

## 2021-07-24 NOTE — Consult Note (Signed)
? ?NAME:  Randy Krasngel Garcia Balthazor Jr., MRN:  [id number], DOB:  [birth year], LOS: 1 ?ADMISSION DATE:  07/23/2021, CONSULTATION DATE: 07/24/2021 ?REFERRING MD: Patient service, CHIEF COMPLAINT: Symptom ? ?History of Present Illness:  ?8930 41 year old male with a history of childhood asthma but not on any medications for asthma treatment.  Notes increasing respiratory distress over the last month with triggers of pollen seasonal changes along with recent smoking and vaping the vaping and attempt to stop smoking.  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  He has been evaluated and thought to need noninvasive mechanical ventilatory support although does not appear to be the case.  He will be evaluated by pulmonary critical care will most likely be placed on steroids bronchodilators long-term asthma care and pulmonary follow-up as an outpatient. ? ?Pertinent  Medical History  ? ?Past Medical History:  ?Diagnosis Date  ? Asthma   ? ? ? ?Significant Hospital Events: ?Including procedures, antibiotic start and stop dates in addition to other pertinent events   ? ? ?Interim History / Subjective:  ?Progressive shortness of breath over 2-week ? ?Objective   ?Blood pressure (!) 130/98, pulse (!) 102, temperature 97.7 ?F (36.5 ?C), temperature source Oral, resp. rate 18, height 6' (1.829 m), weight 116.5 kg, SpO2 95 %. ?   ?FiO2 (%):  [45 %] 45 %  ? ?Intake/Output Summary (Last 24 hours) at 07/24/2021 1111 ?Last data filed at 07/24/2021 0300 ?Gross per 24 hour  ?Intake 60 ml  ?Output 450 ml  ?Net -390 ml  ? ?Filed Weights  ? 07/23/21 1517 07/23/21 2245  ?Weight: 122.5 kg 116.5 kg  ? ? ?Examination: ?General: Obese male no acute distress ?HENT: Edema lymphadenopathy is appreciated ?Lungs: Currently clear to auscultation ?Cardiovascular: Heart sounds are regular ?Abdomen: Abdomen is soft obese positive bowel ?Extremities: Edema negative ?Neuro: Grossly intact without focal  defect ?GU: Voids ? ?Resolved Hospital Problem list   ? ? ?Assessment & Plan:  ?Worsening respiratory distress, oxygen, orthopnea, nonproductive cough, wheezing. ?41 year old with a history of insecticide pesticides exposure 20 years ago smoker who recently quit restarted and utilize vaping in between and now presents with increasing shortness of breath, orthopnea without lower extremity edema. ?O2 as needed ?Steroids ?Antibiotics ?Bronchodilator ?Follow-up with pulmonary as an outpatient ?Check for respiratory viral panel ?Weight loss ?Smoking /vaping to be stopped ?CT scan to be reviewed by pulmonary critical care physician ? ? ? ?Best Practice (right click and "Reselect all SmartList Selections" daily)  ? ?Diet/type: Regular consistency (see orders) ?DVT prophylaxis: LMWH ?GI prophylaxis: PPI ?Lines: N/A ?Foley:  N/A ?Code Status:  full code ?Last date of multidisciplinary goals of care discussion [tbd] ? ?Labs   ?CBC: ?Recent Labs  ?Lab 07/18/21 ?2307 07/19/21 ?0618 07/21/21 ?0144 07/23/21 ?1600 07/23/21 ?1858 07/24/21 ?0034  ?WBC 24.0* 17.9* 15.5* 17.2*  --  14.6*  ?NEUTROABS 16.2* 15.8*  --  6.2  --   --   ?HGB 17.4* 17.4* 17.0 19.3* 18.0* 18.2*  ?HCT 51.8 52.9* 52.1* 55.2* 53.0* 52.7*  ?MCV 85.8 85.7 86.0 84.3  --  83.7  ?PLT 271 261 244 268  --  244  ? ? ?Basic Metabolic Panel: ?Recent Labs  ?Lab 07/18/21 ?2307 07/19/21 ?0618 07/21/21 ?0144 07/23/21 ?1600 07/23/21 ?1858 07/24/21 ?0034  ?NA 139 138 138 138 137 137  ?K 3.7 4.5 3.5 4.2 4.6 4.6  ?CL 102 101 100 106  --  101  ?CO2 27 27 28  24  --  24  ?GLUCOSE 111* 153* 98 103*  --  127*  ?BUN 8 10 18 6   --  11  ?CREATININE 0.83 0.84 0.94 0.94  --  0.99  ?CALCIUM 9.3 9.1 8.9 9.6  --  9.4  ?MG  --  2.5*  --   --   --   --   ? ?GFR: ?Estimated Creatinine Clearance: 132.1 mL/min (by C-G formula based on SCr of 0.99 mg/dL). ?Recent Labs  ?Lab 07/19/21 ?0618 07/21/21 ?0144 07/23/21 ?1600 07/24/21 ?0034  ?PROCALCITON <0.10  --   --   --   ?WBC 17.9* 15.5* 17.2* 14.6*   ? ? ?Liver Function Tests: ?Recent Labs  ?Lab 07/18/21 ?2307 07/19/21 ?0618 07/23/21 ?1600  ?AST 46* 36 40  ?ALT 54* 51* 83*  ?ALKPHOS 66 68 59  ?BILITOT 0.6 0.6 0.5  ?PROT 7.8 7.9 7.2  ?ALBUMIN 4.3 4.3 4.1  ? ?No results for input(s): LIPASE, AMYLASE in the last 168 hours. ?No results for input(s): AMMONIA in the last 168 hours. ? ?ABG ?   ?Component Value Date/Time  ? PHART 7.376 07/23/2021 1858  ? PCO2ART 49.4 (H) 07/23/2021 1858  ? PO2ART 152 (H) 07/23/2021 1858  ? HCO3 29.0 (H) 07/23/2021 1858  ? TCO2 30 07/23/2021 1858  ? O2SAT 99 07/23/2021 1858  ?  ? ?Coagulation Profile: ?No results for input(s): INR, PROTIME in the last 168 hours. ? ?Cardiac Enzymes: ?No results for input(s): CKTOTAL, CKMB, CKMBINDEX, TROPONINI in the last 168 hours. ? ?HbA1C: ?Hgb A1c MFr Bld  ?Date/Time Value Ref Range Status  ?07/14/2021 04:18 AM 5.3 4.8 - 5.6 % Final  ?  Comment:  ?  (NOTE) ?Pre diabetes:          5.7%-6.4% ? ?Diabetes:              >6.4% ? ?Glycemic control for   <7.0% ?adults with diabetes ?  ? ? ?CBG: ?No results for input(s): GLUCAP in the last 168 hours. ? ?Review of Systems:   ?10 point review of system taken, please see HPI for positives and negatives. ?Recently quit smoking short period of vaping resumption of tobacco abuse and again has quit smoking. ?Positive for intermittent wheeze ?Positive for orthopnea ?Positive for increasing shortness of breath ?Questionable vocal cord dysfunction ? ?Past Medical History:  ?He,  has a past medical history of Asthma.  ?Tobacco abuse ?Vaping ?Childhood asthma ? ? ?Surgical History:  ?History reviewed. No pertinent surgical history.  ? ?Social History:  ? reports that he has been smoking cigarettes. He has a 3.00 pack-year smoking history. He has never used smokeless tobacco. He reports that he does not currently use alcohol. He reports that he does not currently use drugs.  ? ?Family History:  ?His family history is not on file.  ? ?Allergies ?No Known Allergies  ? ?Home  Medications  ?Prior to Admission medications   ?Medication Sig Start Date End Date Taking? Authorizing Provider  ?acetaminophen (TYLENOL) 500 MG tablet Take 1,000 mg by mouth every 6 (six) hours as needed for mild pain.   Yes [provider]  ?albuterol (PROVENTIL) (2.5 MG/3ML) 0.083% nebulizer solution Take 3 mLs (2.5 mg total) by nebulization every 6 (six) hours as needed for wheezing or shortness of breath. 07/15/21  Yes 07/17/21, MD  ?albuterol (VENTOLIN HFA) 108 (90 Base) MCG/ACT inhaler Inhale 2 puffs into the lungs every 6 (six) hours as needed for wheezing or shortness of breath.   Yes  [provider]  ?guaiFENesin (ROBITUSSIN) 100 MG/5ML liquid Take 5 mLs by mouth every 4 (four) hours as needed for cough or to loosen phlegm.   Yes [provider]  ?  ? ?Critical care time: na ?  ? ?Brett Canales Mylin Gignac ACNP ?Acute Care Nurse Practitioner ?[facility] ?Please consult Amion ?07/24/2021, 11:11 AM ? ? ? ? ?

## 2021-07-24 NOTE — Progress Notes (Signed)
Patient upset because he can't eat or drink at this time.  Diet order request after CT Scan was finished since he didn't need or have the Bipap on previously to floor admission.  Patient breathing has been contained with 2-3 L of oxygen via nasal cannula.  Patient did wear Bipap at bedtime after returning from CT to help assist him while sleeping.  Patient has woken up around 0645 and states "If I don't get something to eat drink., I am going home."  On-call FMTS intern and second with no new diet orders given with above information. ?

## 2021-07-25 ENCOUNTER — Inpatient Hospital Stay (HOSPITAL_COMMUNITY): Payer: 59

## 2021-07-25 ENCOUNTER — Other Ambulatory Visit (HOSPITAL_COMMUNITY): Payer: Self-pay

## 2021-07-25 DIAGNOSIS — J9601 Acute respiratory failure with hypoxia: Secondary | ICD-10-CM | POA: Diagnosis not present

## 2021-07-25 DIAGNOSIS — J4551 Severe persistent asthma with (acute) exacerbation: Secondary | ICD-10-CM | POA: Diagnosis not present

## 2021-07-25 DIAGNOSIS — R0603 Acute respiratory distress: Secondary | ICD-10-CM | POA: Diagnosis not present

## 2021-07-25 LAB — BASIC METABOLIC PANEL
Anion gap: 9 (ref 5–15)
BUN: 12 mg/dL (ref 6–20)
CO2: 26 mmol/L (ref 22–32)
Calcium: 9.1 mg/dL (ref 8.9–10.3)
Chloride: 101 mmol/L (ref 98–111)
Creatinine, Ser: 1.07 mg/dL (ref 0.61–1.24)
GFR, Estimated: >60 mL/min (ref >60)
Glucose, Bld: 136 mg/dL — ABNORMAL HIGH (ref 70–99)
Potassium: 4.1 mmol/L (ref 3.5–5.1)
Sodium: 136 mmol/L (ref 135–145)

## 2021-07-25 MED ORDER — MONTELUKAST SODIUM 10 MG PO TABS
10.0000 mg | ORAL_TABLET | Freq: Every day | ORAL | 1 refills | Status: AC
Start: 2021-07-25 — End: ?
  Filled 2021-07-25: qty 30, 30d supply, fill #0

## 2021-07-25 MED ORDER — FLUTICASONE-SALMETEROL 250-50 MCG/ACT IN AEPB
1.0000 | INHALATION_SPRAY | Freq: Two times a day (BID) | RESPIRATORY_TRACT | 1 refills | Status: DC
  Filled 2021-07-25: qty 60, 30d supply, fill #0
  Filled 2021-08-19: qty 60, 30d supply, fill #1

## 2021-07-25 MED ORDER — ALBUTEROL SULFATE HFA 108 (90 BASE) MCG/ACT IN AERS
2.0000 | INHALATION_SPRAY | Freq: Four times a day (QID) | RESPIRATORY_TRACT | 1 refills | Status: DC | PRN
  Filled 2021-07-25: qty 8.5, 25d supply, fill #0
  Filled 2021-08-19: qty 8.5, 25d supply, fill #1

## 2021-07-25 MED ORDER — PREDNISONE 20 MG PO TABS
40.0000 mg | ORAL_TABLET | Freq: Every day | ORAL | Status: DC
  Administered 2021-07-26: 40 mg via ORAL
  Filled 2021-07-25: qty 2

## 2021-07-25 MED ORDER — IPRATROPIUM-ALBUTEROL 0.5-2.5 (3) MG/3ML IN SOLN: 3.0000 mL | Freq: Four times a day (QID) | RESPIRATORY_TRACT | Status: DC | PRN

## 2021-07-25 MED ORDER — METHYLPREDNISOLONE SODIUM SUCC 125 MG IJ SOLR: 80.0000 mg | Freq: Every day | INTRAMUSCULAR | Status: DC

## 2021-07-25 NOTE — TOC Benefit Eligibility Note (Signed)
Patient Advocate Encounter ?  ?Insurance verification completed.   ?  ?The patient is currently admitted and upon discharge could be taking DULERA. ?  ?The current 30 day co-pay is, $321.22.  ? ?The patient is currently admitted and upon discharge could be taking BREO. ?  ?The current 30 day co-pay is, $324.86.  ? ?The patient is currently admitted and upon discharge could be taking ADVAIR (generic). ?  ?The current 30 day co-pay is, $84.86.  ? ?The patient is currently admitted and upon discharge could be taking SYMBICORT. ?  ?NOT COVERED ? ?Patient does have a HIGH deductible plan with an unmet deductible of [phone]. ? ?The patient is insured through [employer]. ? ? ?  ? ?

## 2021-07-25 NOTE — Progress Notes (Addendum)
Patient on room air from 1900-0036 with SpO2 ranging from 90-94% ?Patient placed on Bipap from 0037-0522 with SpO2 ranging from 95-98%  ?(Bipap settings I 12 E 5 R 14 O2 45%) ?Patient currently on room air with SpO2 at 90%. ? ?Patient had no complaints of shortness of breathe and no signs of respiratory distress throughout the shift.  Patient no c/o dyspnea when ambulating back and forth from the bed to the bathroom. ? ?

## 2021-07-25 NOTE — TOC Progression Note (Signed)
Transition of Care (TOC) - Progression Note  ? ? ?Patient Details  ?Name: Randy Newman. ?MRN: [id number] ?Date of Birth: August 06, 1980 ? ?Transition of Care (TOC) CM/SW Contact  ?Beckie Busing, RN ?Phone [phone] ? ?07/25/2021, 1:07 PM ? ?Clinical Narrative:    ? ?Transition of Care (TOC) Screening Note ? ? ?Patient Details  ?Name: Randy Newman. ?Date of Birth: Sep 20, 1980 ? ? ?Transition of Care (TOC) CM/SW Contact:    ?Beckie Busing, RN ?Phone Number: ?07/25/2021, 1:07 PM ? ? ? ?Transition of Care Department [facility]) has reviewed patient and no TOC needs have been identified at this time. We will continue to monitor patient advancement through interdisciplinary progression rounds. If new patient transition needs arise, please place a TOC consult. ? ? ? ? ?  ?  ? ?Expected Discharge Plan and Services ?  ?  ?  ?  ?  ?                ?  ?  ?  ?  ?  ?  ?  ?  ?  ?  ? ? ?Social Determinants of Health (SDOH) Interventions ?  ? ?Readmission Risk Interventions ? ?  07/21/2021  ? 11:14 AM  ?Readmission Risk Prevention Plan  ?Medication Screening Complete  ?Transportation Screening Complete  ? ? ?

## 2021-07-25 NOTE — Progress Notes (Addendum)
? ?NAME:  Randy Shelnutt., MRN:  [id number], DOB:  26-Mar-1981, LOS: 2 ?ADMISSION DATE:  07/23/2021, CONSULTATION DATE: 07/24/2021 ?REFERRING MD: Patient service, CHIEF COMPLAINT: Symptom ? ?History of Present Illness:  ?[age] year old male with a history of childhood asthma but not on any medications for asthma treatment.  Notes increasing respiratory distress over the last month with triggers of pollen seasonal changes along with recent smoking and vaping the vaping and attempt to stop smoking.  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  He has been evaluated and thought to need noninvasive mechanical ventilatory support although does not appear to be the case.  He will be evaluated by pulmonary critical care will most likely be placed on steroids bronchodilators long-term asthma care and pulmonary follow-up as an outpatient. ? ?Pertinent  Medical History  ? ?Past Medical History:  ?Diagnosis Date  ? Asthma   ? ? ? ?Significant Hospital Events: ?Including procedures, antibiotic start and stop dates in addition to other pertinent events   ? ? ?Interim History / Subjective:  ?Reports feeling better ? ?Objective   ?Blood pressure (!) 136/99, pulse 69, temperature 97.7 ?F (36.5 ?C), temperature source Oral, resp. rate 16, height 6' (1.829 m), weight 116.5 kg, SpO2 94 %. ?   ?FiO2 (%):  [45 %] 45 %  ? ?Intake/Output Summary (Last 24 hours) at 07/25/2021 1057 ?Last data filed at 07/25/2021 0500 ?Gross per 24 hour  ?Intake 1680 ml  ?Output 1700 ml  ?Net -20 ml  ? ?Filed Weights  ? 07/23/21 1517 07/23/21 2245  ?Weight: 122.5 kg 116.5 kg  ? ? ?Examination: ?General: Obese male no acute distress ?HEENT: MM pink/moist no JVD or lymphadenopathy ?Neuro: Grossly intact without focal defect ?CV: Heart sounds are regular ?PULM: Diminished throughout currently on room air ?Utilize BiPAP during the night ?GI: soft, bsx4 active  ?GU: Voids ?Extremities: warm/dry,  negative edema  ?Skin: no rashes or lesions ? ? ?Resolved Hospital Problem list   ? ? ?Assessment & Plan:  ?Worsening respiratory distress, oxygen, orthopnea, nonproductive cough, wheezing. ?41 year old with a history of insecticide pesticides exposure 20 years ago smoker who recently quit restarted and utilize vaping in between and now presents with increasing shortness of breath, orthopnea without lower extremity edema.  ?Asthma poorly controlled ?Seasonal allergies ?Recent vaping/ tobacco use ?THC consumption ?Suspected sleep apnea ? ?Wean FiO2 as tolerated ?Decrease steroids to 60 mg every 12 hours ?Continue bronchodilators and inhaled steroids ?Negative respiratory virus panel ?We will discharge you on a long-acting beta agonist along with inhaled corticosteroids ?He needs to follow-up with pulmonary as an outpatient ?He is a sleep study as an outpatient to rule out sleep apnea ?He has been counseled not to vape, not smoke, and not to smoke marijuana ? ? ? ? ? ?Best Practice (right click and "Reselect all SmartList Selections" daily)  ? ?Diet/type: Regular consistency (see orders) ?DVT prophylaxis: LMWH ?GI prophylaxis: PPI ?Lines: N/A ?Foley:  N/A ?Code Status:  full code ?Last date of multidisciplinary goals of care discussion [tbd] ? ?Labs   ?CBC: ?Recent Labs  ?Lab 07/18/21 ?2307 07/19/21 ?0618 07/21/21 ?0144 07/23/21 ?1600 07/23/21 ?1858 07/24/21 ?0034  ?WBC 24.0* 17.9* 15.5* 17.2*  --  14.6*  ?NEUTROABS 16.2* 15.8*  --  6.2  --   --   ?HGB 17.4* 17.4* 17.0 19.3* 18.0* 18.2*  ?HCT 51.8 52.9* 52.1* 55.2* 53.0* 52.7*  ?MCV 85.8 85.7 86.0 84.3  --  83.7  ?PLT 271 261 244 268  --  244  ? ? ?Basic Metabolic Panel: ?Recent Labs  ?Lab 07/19/21 ?0618 07/21/21 ?0144 07/23/21 ?1600 07/23/21 ?1858 07/24/21 ?13080034 07/25/21 ?0026  ?NA 138 138 138 137 137 136  ?K 4.5 3.5 4.2 4.6 4.6 4.1  ?CL 101 100 106  --  101 101  ?CO2 27 28 24   --  24 26  ?GLUCOSE 153* 98 103*  --  127* 136*  ?BUN 10 18 6   --  11 12  ?CREATININE 0.84  0.94 0.94  --  0.99 1.07  ?CALCIUM 9.1 8.9 9.6  --  9.4 9.1  ?MG 2.5*  --   --   --   --   --   ? ?GFR: ?Estimated Creatinine Clearance: 122.2 mL/min (by C-G formula based on SCr of 1.07 mg/dL). ?Recent Labs  ?Lab 07/19/21 ?0618 07/21/21 ?0144 07/23/21 ?1600 07/24/21 ?0034  ?PROCALCITON <0.10  --   --   --   ?WBC 17.9* 15.5* 17.2* 14.6*  ? ? ?Liver Function Tests: ?Recent Labs  ?Lab 07/18/21 ?2307 07/19/21 ?0618 07/23/21 ?1600  ?AST 46* 36 40  ?ALT 54* 51* 83*  ?ALKPHOS 66 68 59  ?BILITOT 0.6 0.6 0.5  ?PROT 7.8 7.9 7.2  ?ALBUMIN 4.3 4.3 4.1  ? ?No results for input(s): LIPASE, AMYLASE in the last 168 hours. ?No results for input(s): AMMONIA in the last 168 hours. ? ?ABG ?   ?Component Value Date/Time  ? PHART 7.376 07/23/2021 1858  ? PCO2ART 49.4 (H) 07/23/2021 1858  ? PO2ART 152 (H) 07/23/2021 1858  ? HCO3 29.0 (H) 07/23/2021 1858  ? TCO2 30 07/23/2021 1858  ? O2SAT 99 07/23/2021 1858  ?  ? ?Coagulation Profile: ?No results for input(s): INR, PROTIME in the last 168 hours. ? ?Cardiac Enzymes: ?No results for input(s): CKTOTAL, CKMB, CKMBINDEX, TROPONINI in the last 168 hours. ? ?HbA1C: ?Hgb A1c MFr Bld  ?Date/Time Value Ref Range Status  ?07/14/2021 04:18 AM 5.3 4.8 - 5.6 % Final  ?  Comment:  ?  (NOTE) ?Pre diabetes:          5.7%-6.4% ? ?Diabetes:              >6.4% ? ?Glycemic control for   <7.0% ?adults with diabetes ?  ? ? ?CBG: ?No results for input(s): GLUCAP in the last 168 hours. ? ? ?Critical care time: na ?  ? ?Randy Newman ?Acute Care Nurse Practitioner ?[facility] ?Please consult Amion ?07/25/2021, 10:57 AM ? ? ? ?I agree with the Advanced Practitioner's note, impression, and recommendations as outlined. I have taken an independent interval history, reviewed the chart and examined the patient.  My medical decision making is as follows: ? ? ?Subjective: ?Feeling better since admission ? ?Objective: ?Vitals:  ? 07/25/21 0749 07/25/21 1105  ?BP:    ?Pulse:    ?Resp:    ?Temp:     ?SpO2: 94% 96%  ? ? ?On room air ?No increased work of breathing, trace end expiratory wheezes, shallow inspirations ?Tachycardic regular ? ? ?Labs/Imaging: ?Chest xray this morning is clear.  ? ?Assessment and Plan: ?Severe persistent asthma with acute exacerbation ?Tobacco use disorder with vaping ?Presumed OSA ? ?Plan to transition to prednisone tomorrow 40 mg daily x 3 days, 30 mg daily x 3 days, 20 mg daily x 3 days, then 10 mg daily x 3 day. We will call him to follow up in the office with us.  ?Continue nocturnal  bipap while in the hospital, but will need formal sleep study as outpatient.  ?As long as he continues to feel well tomorrow, no objection to discharge. Call us if needed.  ? ?Randy Newman ?[facility] ?07/25/2021 5:11 PM  ?Pager: see AMION ? ?If no response to pager, please call critical care on call (see AMION) until 7pm ?After 7:00 pm call Elink   ? ? ? ?

## 2021-07-25 NOTE — Plan of Care (Signed)
  Problem: Education: Goal: Knowledge of General Education information will improve Description: Including pain rating scale, medication(s)/side effects and non-pharmacologic comfort measures Outcome: Progressing   Problem: Activity: Goal: Risk for activity intolerance will decrease Outcome: Progressing   Problem: Pain Managment: Goal: General experience of comfort will improve Outcome: Progressing   Problem: Safety: Goal: Ability to remain free from injury will improve Outcome: Progressing   Problem: Skin Integrity: Goal: Risk for impaired skin integrity will decrease Outcome: Progressing   Problem: Safety: Goal: Ability to remain free from injury will improve Outcome: Progressing   

## 2021-07-26 DIAGNOSIS — J4551 Severe persistent asthma with (acute) exacerbation: Principal | ICD-10-CM

## 2021-07-26 DIAGNOSIS — Z87891 Personal history of nicotine dependence: Secondary | ICD-10-CM | POA: Diagnosis not present

## 2021-07-26 DIAGNOSIS — F1290 Cannabis use, unspecified, uncomplicated: Secondary | ICD-10-CM | POA: Diagnosis not present

## 2021-07-26 LAB — IGE: IgE (Immunoglobulin E), Serum: 1065 IU/mL — ABNORMAL HIGH (ref 6–495)

## 2021-07-26 MED ORDER — PREDNISONE 10 MG PO TABS
ORAL_TABLET | ORAL | 0 refills | Status: DC
Start: 2021-07-26 — End: 2021-08-20

## 2021-07-26 NOTE — Progress Notes (Signed)
PT refuses to wear BIPAP ?

## 2021-07-26 NOTE — Discharge Summary (Addendum)
Family Medicine Teaching Service ?Hospital Discharge Summary ? ?Patient name: Randy Newman. Medical record number: [id number] ?Date of birth: 09-29-1980 Age: 41 y.o. Gender: male ?Date of Admission: 07/23/2021  Date of Discharge: 07/26/21 ?Admitting Physician: Martyn Malay, MD ? ?Primary Care Provider: Pcp, No ?Consultants: Pulmonology ? ?Indication for Hospitalization: Asthma Exacerbation ? ?Discharge Diagnoses/Problem List:  ?Asthma Exacerbation ?Tobacco use disorder ?Marijuana use disorder ?GERD ?Obesity ? ?Disposition: Home ? ?Discharge Condition: stable ? ?Discharge Exam:  ?Blood pressure (!) 133/95, pulse 66, temperature 98.1 ?F (36.7 ?C), temperature source Oral, resp. rate 17, height 6' (1.829 m), weight 256 lb 13.4 oz (116.5 kg), SpO2 96 %. ?General: Resting comfortably on room air, NAD ?CV: RRR ?Respiratory: CTAB ?Abdomen: soft nontender ?Extremities: warm, dry ? ?Brief Hospital Course:  ?Randy Mccurty Barto Murtagh. is a 41 y.o.male with a history of GERD, obesity, tobacco use, vaping, remote insecticide/pesticide use who was admitted to the [facility] [place] Teaching Service at [facility] for acute respiratory distress. His hospital course is detailed below: ? ?Asthma Exacerbation ?Acute Hypoxic Respiratory failure  ?Patient was admitted due to shortness of breath and has been admitted 2 times recently due to similar complaints.  Patient initially required BiPAP in the ED but was transitioned back to nasal cannula.  Work up included a CXR that was negative, CTA chest without evidence of PE but did show peribronchial thickening.  Pulmonology was consulted and they suggested that this was a severe asthma exacerbation based on CT chest and elevated eosinophils.  Echo obtained and showed EF >75% with LV hyperdynamic function and moderate LVH, Grade 1 DD.  Patient was started on high-dose steroids of IV Solu-Medrol 240 mg/day, Pulmicort and Brovana twice daily, and Singulair daily.  Patient does have a tobacco  use history as well as vaping use history. UDS positive for marijuana only. Patient was tapered and transition to oral steroids and discharged with instructions to finish an 11-day steroid taper. Given difficulties with affording inhalers he was discharged with Wixhela, Albuterol PRN, and Singulair. Patient instructed to follow-up outpatient with Pulmonology. ? ?Other chronic conditions were medically managed with home medications and formulary alternatives as necessary (tobacco use, GERD, obesity) ? ?PCP Follow-up Recommendations: ?Outpatient PFT's ?Continue to counsel patient on tobacco, marijuana and vaping avoidance. ?Order outpatient sleep studies to assess for sleep apnea  ?Follow up IgE and Legionella results ?CTA did show incidental cholelithiasis or gallbladder sludge; asymptomatic  ? ?Significant Procedures: none ? ?Significant Labs and Imaging:  ?Recent Labs  ?Lab 07/21/21 ?0144 07/23/21 ?1600 07/23/21 ?1858 07/24/21 ?0034  ?WBC 15.5* 17.2*  --  14.6*  ?HGB 17.0 19.3* 18.0* 18.2*  ?HCT 52.1* 55.2* 53.0* 52.7*  ?PLT 244 268  --  244  ? ?Recent Labs  ?Lab 07/21/21 ?0144 07/23/21 ?1600 07/23/21 ?1858 07/24/21 ?[id number] 07/25/21 ?0026  ?NA 138 138 137 137 136  ?K 3.5 4.2 4.6 4.6 4.1  ?CL 100 106  --  101 101  ?CO2 28 24  --  24 26  ?GLUCOSE 98 103*  --  127* 136*  ?BUN 18 6  --  11 12  ?CREATININE 0.94 0.94  --  0.99 1.07  ?CALCIUM 8.9 9.6  --  9.4 9.1  ?ALKPHOS  --  59  --   --   --   ?AST  --  40  --   --   --   ?ALT  --  83*  --   --   --   ?ALBUMIN  --  4.1  --   --   --   ? ?CXR 4/7 ?IMPRESSION: ?Low lung volumes. Otherwise, no radiographic evidence of acute ?cardiopulmonary process. ? ?CTA Chest 4/6 ?IMPRESSION: ?1. No evidence of central pulmonary embolus. More distal vessels are ?not well opacified and are unable to be evaluated. ?2. Peribronchial thickening suggesting airways disease. No focal ?consolidation. ?3. Cholelithiasis or gallbladder sludge. ? ?Echo 4/6 ?1. Left ventricular ejection fraction, by  estimation, is >75%. The left  ?ventricle has hyperdynamic function. The left ventricle has no regional  ?wall motion abnormalities. There is moderate left ventricular hypertrophy.  ?Left ventricular diastolic  ?parameters are consistent with Grade I diastolic dysfunction (impaired  ?relaxation).  ? 2. Right ventricular systolic function is hyperdynamic. The right  ?ventricular size is normal. Tricuspid regurgitation signal is inadequate  ?for assessing PA pressure.  ? 3. The mitral valve is grossly normal. No evidence of mitral valve  ?regurgitation.  ? 4. The aortic valve is tricuspid. Aortic valve regurgitation is not  ?visualized.  ? 5. The inferior vena cava is normal in size with greater than 50%  ?respiratory variability, suggesting right atrial pressure of 3 mmHg.  ? 6. Agitated saline contrast bubble study was negative, with no evidence  ?of any interatrial shunt.  ? ?CXR 4/5 ?IMPRESSION: ?No acute cardiopulmonary findings. ? ?Results/Tests Pending at Time of Discharge: IgE level and Legionella ? ?Discharge Medications:  ?Allergies as of 07/26/2021   ?No Known Allergies ?  ? ?  ?Medication List  ?  ? ?STOP taking these medications   ? ?guaiFENesin 100 MG/5ML liquid ?Commonly known as: ROBITUSSIN ?  ? ?  ? ?TAKE these medications   ? ?acetaminophen 500 MG tablet ?Commonly known as: TYLENOL ?Take 1,000 mg by mouth every 6 (six) hours as needed for mild pain. ?  ?albuterol (2.5 MG/3ML) 0.083% nebulizer solution ?Commonly known as: PROVENTIL ?Take 3 mLs (2.5 mg total) by nebulization every 6 (six) hours as needed for wheezing or shortness of breath. ?  ?albuterol 108 (90 Base) MCG/ACT inhaler ?Commonly known as: VENTOLIN HFA ?Inhale 2 puffs into the lungs every 6 (six) hours as needed for wheezing or shortness of breath. ?  ?fluticasone-salmeterol 250-50 MCG/ACT Aepb ?Commonly known as: Wixela Inhub ?Inhale 1 puff into the lungs in the morning and at bedtime. ?  ?montelukast 10 MG tablet ?Commonly known as:  SINGULAIR ?Take 1 tablet (10 mg total) by mouth at bedtime. ?  ?predniSONE 10 MG tablet ?Commonly known as: DELTASONE ?Take 4 tablets with breakfast for 2 days ?THEN take 3 tablets with breakfast for 3 days ?THEN take 2 tablets with breakfast for 3 days ?THEN take 1 tablets with breakfast for 3 days ?  ? ?  ? ? ?Discharge Instructions: Please refer to Patient Instructions section of EMR for full details.  Patient was counseled important signs and symptoms that should prompt return to medical care, changes in medications, dietary instructions, activity restrictions, and follow up appointments.  ? ?Follow-Up Appointments: ? ? ?France Ravens, MD ?07/26/2021, 9:23 AM ?PGY-1, [facility] ?

## 2021-07-26 NOTE — Progress Notes (Signed)
FPTS Brief Note ?Reviewed patient's vitals, recent notes.  ?Vitals:  ? 07/25/21 2106 07/25/21 2352  ?BP:  (!) 135/92  ?Pulse:  84  ?Resp:  20  ?Temp:  98 ?F (36.7 ?C)  ?SpO2: 96% 94%  ? ?At this time, no change in plan from day progress note.  ?Bree Heinzelman, DO ?Page [phone] with questions about this patient.  ?  ?

## 2021-07-27 ENCOUNTER — Telehealth: Payer: Self-pay | Admitting: Internal Medicine

## 2021-07-27 NOTE — Telephone Encounter (Signed)
Needs hospital follow up appointment for asthma with ND or APP in 2 weeks.  ?

## 2021-07-28 LAB — LEGIONELLA PNEUMOPHILA SEROGP 1 UR AG: L. pneumophila Serogp 1 Ur Ag: NEGATIVE

## 2021-07-28 NOTE — Telephone Encounter (Signed)
ATC to schedule hospital f/u. No voicemail setup ?

## 2021-07-29 ENCOUNTER — Inpatient Hospital Stay: Payer: 59 | Admitting: Family Medicine

## 2021-07-29 NOTE — Progress Notes (Signed)
? ? ?  SUBJECTIVE:  ? ?CHIEF COMPLAINT / HPI: hospital f/u for asthma exacerbation  ? ?Patient presents for hospital follow-up after asthma exacerbation.  Patient reports that his shortness of breath and cough have improved.  He continues to use his Wixela and albuterol inhalers.  Patient reports that he uses albuterol nebulizer at night before bedtime.  He denies any shortness of breath at this time denies any chest discomfort.  Patient states that he has noticed some oral irritation.  He states that he has been using proper technique with his albuterol inhaler and rinses with mouthwash and brushes his teeth after usage.  He reports that he has not needed his albuterol inhaler as much during the day.  Patient states that he has slowly been increasing the amount of walking he does but states that he would feel more comfortable returning to work on 08/04/2021.  Patient reports that he has been having increased appetite and insomnia since being on his steroid taper.  He reports that he has started the 3 tablets/day portion of his taper. ?He denies any difficulty swallowing. ? ?PERTINENT  PMH / PSH:  ?Tobacco use ?Acute bronchitis ?Gastric reflux ? ?OBJECTIVE:  ? ?BP 123/75   Pulse 76   Temp 97.7 ?F (36.5 ?C)   Ht 6' (1.829 m)   Wt 279 lb 3.2 oz (126.6 kg)   SpO2 96%   BMI 37.87 kg/m?   ?Physical Exam ?Constitutional:   ?   General: He is not in acute distress. ?   Appearance: Normal appearance. He is not ill-appearing.  ?HENT:  ?   Right Ear: External ear normal.  ?   Left Ear: External ear normal.  ?   Nose: Nose normal.  ?   Mouth/Throat:  ?   Mouth: Mucous membranes are moist.  ?   Pharynx: No oropharyngeal exudate or posterior oropharyngeal erythema.  ?   Comments: Tonsils enlarged  ?Eyes:  ?   Conjunctiva/sclera: Conjunctivae normal.  ?Cardiovascular:  ?   Rate and Rhythm: Normal rate and regular rhythm.  ?   Pulses: Normal pulses.  ?   Heart sounds: Normal heart sounds. No murmur heard. ?  No friction rub.   ?Pulmonary:  ?   Effort: Pulmonary effort is normal.  ?   Breath sounds: Normal breath sounds. No stridor. No wheezing, rhonchi or rales.  ?Abdominal:  ?   General: Bowel sounds are normal.  ?   Tenderness: There is no abdominal tenderness.  ?Neurological:  ?   Mental Status: He is alert and oriented to person, place, and time.  ? ? ?ASSESSMENT/PLAN:  ? ?Asthma exacerbation ?Counseled patient to continue albuterol as needed as well as continuing his Wixela inhaler daily ?Oxygen saturation 96% today patient reports overall improved symptoms for recent exacerbation ?Patient is to complete his steroid taper ? ?GERD (gastroesophageal reflux disease) ?Patient reports symptoms (throat irritation)_ likely related to silent reflux he is currently not on any PPI or H2 blocker ?We will prescribe omeprazole  ?Patient is to follow-up in 2 weeks ?Also recommended the patient sleep with humidifier to help with seasonal nasal passages at night ?Encouraged drinking warm teas with honey ? ?  ? ? ?Eulis Foster, MD ?[place]  ?

## 2021-07-29 NOTE — Progress Notes (Deleted)
? ? ? ?  SUBJECTIVE:  ? ?CHIEF COMPLAINT / HPI:  ? ?Randy Newman. is a 41 y.o. male presents for hospital follow up  ? ?Patient admitted to hospital between ** and ** and treated for ***. Discharged on **. Presents today for follow up **. Reports improvement in ** Requires repeat ** BMP/ renal function panel/CBC today. ?*** ?Asthma Exacerbation ?Acute Hypoxic Respiratory failure  ?Patient was admitted due to shortness of breath and has been admitted 2 times recently due to similar complaints.  Patient initially required BiPAP in the ED but was transitioned back to nasal cannula.  Work up included a CXR that was negative, CTA chest without evidence of PE but did show peribronchial thickening.  Pulmonology was consulted and they suggested that this was a severe asthma exacerbation based on CT chest and elevated eosinophils.  Echo obtained and showed EF >75% with LV hyperdynamic function and moderate LVH, Grade 1 DD.  Patient was started on high-dose steroids of IV Solu-Medrol 240 mg/day, Pulmicort and Brovana twice daily, and Singulair daily.  Patient does have a tobacco use history as well as vaping use history. UDS positive for marijuana only. Patient was tapered and transition to oral steroids and discharged with instructions to finish an 11-day steroid taper. Given difficulties with affording inhalers he was discharged with Wixhela, Albuterol PRN, and Singulair. Patient instructed to follow-up outpatient with Pulmonology. ?  ?Other chronic conditions were medically managed with home medications and formulary alternatives as necessary (tobacco use, GERD, obesity) ?  ?PCP Follow-up Recommendations: ?Outpatient PFT's ?Continue to counsel patient on tobacco, marijuana and vaping avoidance. ?Order outpatient sleep studies to assess for sleep apnea  ?Follow up IgE and Legionella results ?CTA did show incidental cholelithiasis or gallbladder sludge; asymptomatic ?  ? ?Health Maintenance Due  ?Topic  ?  COVID-19 Vaccine (1)  ? Hepatitis C Screening   ? TETANUS/TDAP   ? ?  ? ?PERTINENT  PMH / PSH:  ? ?OBJECTIVE:  ? ?There were no vitals taken for this visit.  ? ?General: Alert, no acute distress ?Cardio: Normal S1 and S2, RRR, no r/m/g ?Pulm: CTAB, normal work of breathing ?Abdomen: Bowel sounds normal. Abdomen soft and non-tender.  ?Extremities: No peripheral edema.  ?Neuro: Cranial nerves grossly intact  ? ?ASSESSMENT/PLAN:  ? ?No problem-specific Assessment & Plan notes found for this encounter. ?  ? ?Lattie Haw, MD PGY-3 ?[place]  ?

## 2021-07-30 ENCOUNTER — Ambulatory Visit (INDEPENDENT_AMBULATORY_CARE_PROVIDER_SITE_OTHER): Payer: 59 | Admitting: Family Medicine

## 2021-07-30 VITALS — BP 123/75 | HR 76 | Temp 97.7°F | Ht 72.0 in | Wt 279.2 lb

## 2021-07-30 DIAGNOSIS — J4551 Severe persistent asthma with (acute) exacerbation: Secondary | ICD-10-CM

## 2021-07-30 DIAGNOSIS — K219 Gastro-esophageal reflux disease without esophagitis: Secondary | ICD-10-CM | POA: Diagnosis not present

## 2021-07-30 MED ORDER — OMEPRAZOLE 20 MG PO CPDR: 20.0000 mg | DELAYED_RELEASE_CAPSULE | Freq: Every day | ORAL | 0 refills | Status: AC

## 2021-07-30 MED ORDER — NYSTATIN 100000 UNIT/ML MT SUSP: 5.0000 mL | Freq: Four times a day (QID) | OROMUCOSAL | 0 refills | Status: DC | PRN

## 2021-07-30 NOTE — Assessment & Plan Note (Signed)
Counseled patient to continue albuterol as needed as well as continuing his Wixela inhaler daily ?Oxygen saturation 96% today patient reports overall improved symptoms for recent exacerbation ?Patient is to complete his steroid taper ?

## 2021-07-30 NOTE — Assessment & Plan Note (Addendum)
Patient reports symptoms (throat irritation)_ likely related to silent reflux he is currently not on any PPI or H2 blocker ?We will prescribe omeprazole  ?Patient is to follow-up in 2 weeks ?Also recommended the patient sleep with humidifier to help with seasonal nasal passages at night ?Encouraged drinking warm teas with honey ? ?

## 2021-07-30 NOTE — Patient Instructions (Addendum)
I have prescribed a medication to help with your throat symptoms. Pleas take this daily for the next few weeks.  ? ?Please continue your albuterol and Wixela inhalers and complete your steroid taper.  ? ?I will prescribe a mouth wash to help with your symptoms to be used up to 4 times daily.  ? ?Please follow up with our office in 2 weeks.  ? ? ?

## 2021-08-04 ENCOUNTER — Encounter: Payer: Self-pay | Admitting: Family Medicine

## 2021-08-05 ENCOUNTER — Encounter: Payer: Self-pay | Admitting: Family Medicine

## 2021-08-11 NOTE — Patient Instructions (Incomplete)
It was wonderful to see you today. ? ?Please bring ALL of your medications with you to every visit.  ? ?Today we talked about: ? ?-Please contact the Pulmonology office to schedule an appointment. ?[address], [place], [place] [address] ?[phone] ? ?Thank you for choosing [facility].  ? ?Please call [phone] with any questions about today's appointment. ? ?Please be sure to schedule follow up at the front  desk before you leave today.  ? ?Sabino Dick, DO ?PGY-2 Family Medicine   ?

## 2021-08-11 NOTE — Progress Notes (Deleted)
    SUBJECTIVE:   CHIEF COMPLAINT / HPI:   Asthma Follow-Up  Randy Newman. is a 41 y.o. male who presents to the clinic today to follow up on his asthma. He was recently hospitalized from 4/5 to 4/8 for acute asthma exacerbation.  He was discharged on a steroid taper and given prescriptions for Wixela, albuterol as needed, and Singulair.  He has since completed his steroid taper.  He reports good compliance with his medications.***He was recommended to follow-up with pulmonology, however appears that they have had difficulty connecting with him to schedule an appointment.  Health Maintenance Due for Hepatitis C screening. Will offer COVID vaccine.   PERTINENT  PMH / PSH:  Past Medical History:  Diagnosis Date   Asthma     OBJECTIVE:   There were no vitals taken for this visit. ***  General: NAD, pleasant, able to participate in exam Cardiac: RRR, no murmurs. Respiratory: CTAB, normal effort, No wheezes, rales or rhonchi Abdomen: Bowel sounds present, nontender, nondistended, no hepatosplenomegaly. Extremities: no edema or cyanosis. Skin: warm and dry, no rashes noted Neuro: alert, no obvious focal deficits Psych: Normal affect and mood  ASSESSMENT/PLAN:   No problem-specific Assessment & Plan notes found for this encounter.     Sabino Dick, DO [facility]

## 2021-08-12 ENCOUNTER — Ambulatory Visit: Payer: 59 | Admitting: Family Medicine

## 2021-08-19 ENCOUNTER — Other Ambulatory Visit (HOSPITAL_COMMUNITY): Payer: Self-pay

## 2021-08-19 ENCOUNTER — Other Ambulatory Visit: Payer: Self-pay | Admitting: Family Medicine

## 2021-08-19 MED ORDER — NYSTATIN 100000 UNIT/ML MT SUSP
5.0000 mL | Freq: Four times a day (QID) | ORAL | 0 refills | Status: DC | PRN
Start: 2021-08-19 — End: 2021-09-03

## 2021-08-19 MED ORDER — ALBUTEROL SULFATE HFA 108 (90 BASE) MCG/ACT IN AERS: 2.0000 | INHALATION_SPRAY | Freq: Four times a day (QID) | RESPIRATORY_TRACT | 1 refills | Status: DC | PRN

## 2021-08-20 ENCOUNTER — Ambulatory Visit (INDEPENDENT_AMBULATORY_CARE_PROVIDER_SITE_OTHER): Payer: 59 | Admitting: Family Medicine

## 2021-08-20 ENCOUNTER — Other Ambulatory Visit: Payer: Self-pay

## 2021-08-20 VITALS — BP 130/80 | HR 95 | Ht 72.0 in | Wt 270.4 lb

## 2021-08-20 DIAGNOSIS — J4551 Severe persistent asthma with (acute) exacerbation: Secondary | ICD-10-CM

## 2021-08-20 DIAGNOSIS — R499 Unspecified voice and resonance disorder: Secondary | ICD-10-CM | POA: Diagnosis not present

## 2021-08-20 DIAGNOSIS — J383 Other diseases of vocal cords: Secondary | ICD-10-CM | POA: Diagnosis not present

## 2021-08-20 MED ORDER — PREDNISONE 20 MG PO TABS: 40.0000 mg | ORAL_TABLET | Freq: Every day | ORAL | 0 refills | Status: AC

## 2021-08-20 NOTE — Assessment & Plan Note (Addendum)
Abnormal/strained/weak voice today, compared with voice during hospitalization.  Symptoms have not improved and there is concern for possible vocal cord dysfunction present as well.  Of note, patient is on inhalers for presumed severe asthma.  Must consider that corticosteroid inhaler sprays may worsen vocal cord dysfunction.  Patient given a prescription for prednisone as needed. ?- ENT referral ?

## 2021-08-20 NOTE — Progress Notes (Addendum)
? ? ?  SUBJECTIVE:  ? ?CHIEF COMPLAINT / HPI:  ? ?Patient presents with persistent issues with his breathing.  Despite his several hospitalizations and new medications, he has continued to have multiple episodes of this severe dyspnea and wheezing.  He notes that on 5/1 he had very bad symptoms and used most of his inhaler (albuterol) but that it was not working for him.  He notes that he will have an irritation in his throat that would lead to a coughing episodes that lead to the wheezing and severe dyspnea.  He also notes that his voice has not been back to normal since the episodes that led to his hospitalizations and that it is more weak and strained sounding. He also reports that his cough is worse when he is laying down, he uses his nebulizer before bed and that does help him sleep. ? ? ?PERTINENT  PMH / PSH: Reviewed ? ?OBJECTIVE:  ? ?BP 130/80   Pulse 95   Ht 6' (1.829 m)   Wt 270 lb 6 oz (122.6 kg)   SpO2 94%   BMI 36.67 kg/m?   ?Gen: well-appearing, NAD ?HEENT: Oropharynx nonerythematous, tonsils nonobstructive, no obvious defect to be seen, somewhat poor dentition.  Voice does sound somewhat hoarse and weaker. ?CV: RRR, no m/r/g appreciated, no peripheral edema ?Pulm: CTAB, no wheezes/crackles ?Psych: Alert and oriented, appropriate mood and affect ? ?ASSESSMENT/PLAN:  ? ?Asthma exacerbation ?Persistent symptoms.  No specific triggers noted, does appear somewhat worse with activity but not consistent.  Question if symptoms are related solely to asthma, he does have wheezing present which does lead me to believe there is a component of asthma (as well as his CT scan while in the hospital).  Do have concern for concha mitten vocal cord dysfunction given abnormal voice and lack of improvement with inhalers.  Do not want patient to end up in the ER again and we will send with a steroid prescription to be filled and used when needed. ?- Prednisone 40 mg x 5 days when exacerbation occurs ?- If patient  initiates prednisone, he is to call the office ?- Pulmonology referral placed ?- ENT referral placed ?- Return precautions given ?- Follow-up in the next several weeks if he does not hear back from specialist ? ?Abnormal voice ?Abnormal/strained/weak voice today, compared with voice during hospitalization.  Symptoms have not improved and there is concern for possible vocal cord dysfunction present as well.  Of note, patient is on inhalers for presumed severe asthma.  Must consider that corticosteroid inhaler sprays may worsen vocal cord dysfunction.  Patient given a prescription for prednisone as needed. ?- ENT referral ?  ? ? ?Stori Royse, DO ?[place]  ?

## 2021-08-20 NOTE — Patient Instructions (Signed)
I am putting in a referral to the ENT (Ears, nose, and throat) and pulmonology (lung) specialists.  ? ?I am also giving you a prescription for steroids that you can take when you start to have severe symptoms. If you start taking it, please let our office know so we can follow closely with you. ?

## 2021-08-20 NOTE — Assessment & Plan Note (Addendum)
Persistent symptoms.  No specific triggers noted, does appear somewhat worse with activity but not consistent.  Question if symptoms are related solely to asthma, he does have wheezing present which does lead me to believe there is a component of asthma (as well as his CT scan while in the hospital).  Do have concern for concha mitten vocal cord dysfunction given abnormal voice and lack of improvement with inhalers.  Do not want patient to end up in the ER again and we will send with a steroid prescription to be filled and used when needed. ?- Prednisone 40 mg x 5 days when exacerbation occurs ?- If patient initiates prednisone, he is to call the office ?- Pulmonology referral placed ?- ENT referral placed ?- Return precautions given ?- Follow-up in the next several weeks if he does not hear back from specialist ?

## 2021-08-29 ENCOUNTER — Ambulatory Visit: Payer: 59 | Admitting: Family Medicine

## 2021-09-01 NOTE — Progress Notes (Signed)
? ?Synopsis: Referred for asthma by Rise Patience, DO ? ?Subjective:  ? ?PATIENT ID: Randy Newman. GENDER: male DOB: [birth year], MRN: [id number] ? ?Chief Complaint  ?Patient presents with  ? Pulmonary Consult  ?  Referred by Alana Lilland, DO. Pt c/o cough and SOB x 6 wks. He is SOB whenever he lies down. He has cough with exertion- past 3 days cough has been prod with green/brown sputum. He has had minimal wheezing and also hoarseness.  He uses his albuterol inhaler and neb daily.   ? ?41yM with history of asthma, vaping, MJ use, suspected sleep-disordered breathing referred for dyspnea, wheeze, covid-19 infection 4 months ago - symptomatically severe but not hospitalized, hadn't been vaccinated for it.  ? ?Episodic dyspnea, cough, chest tightness. Still hoarse since hospitalization but doesn't notice accompanying throat tightness during episodes. He doesn't have sinus congestion, postnasal drainage.  ? ?He snores, no PND. Some sleepiness during the day but he attributes this to nighttime cough.  ? ?Otherwise pertinent review of systems is negative. ? ?No family history of lung disease other than father with COPD 'from working on farm' ? ?Born in [place]. Lived in [place] for a year when he was younger. But has lived all over [place] including in [place], [place], [place], [place]. Smoked 12-13 py, quit 6 weeks ago. MJ in past (joints). He tried a CBD vape pen for a week before his hospitalization and then stopped it.  ? ?Past Medical History:  ?Diagnosis Date  ? Asthma   ?  ? ?No family history on file.  ? ?No past surgical history on file. ? ?Social History  ? ?Socioeconomic History  ? Marital status: Single  ?  Spouse name: Not on file  ? Number of children: Not on file  ? Years of education: Not on file  ? Highest education level: Not on file  ?Occupational History  ? Not on file  ?Tobacco Use  ? Smoking status: Former  ?  Packs/day: 1.50  ?  Years: 15.00  ?  Pack years: 22.50  ?  Types: Cigarettes  ?  Quit date: 06/18/2021  ?   Years since quitting: 0.2  ? Smokeless tobacco: Never  ?Vaping Use  ? Vaping Use: Former  ? Quit date: 05/21/2021  ? Devices: used vape for approx 1 wk  ?Substance and Sexual Activity  ? Alcohol use: Not Currently  ? Drug use: Not Currently  ? Sexual activity: Yes  ?  Birth control/protection: Condom  ?Other Topics Concern  ? Not on file  ?Social History Narrative  ? Not on file  ? ?Social Determinants of Health  ? ?Financial Resource Strain: Not on file  ?Food Insecurity: Not on file  ?Transportation Needs: Not on file  ?Physical Activity: Not on file  ?Stress: Not on file  ?Social Connections: Not on file  ?Intimate Partner Violence: Not on file  ?  ? ?No Known Allergies  ? ?Outpatient Medications Prior to Visit  ?Medication Sig Dispense Refill  ? acetaminophen (TYLENOL) 500 MG tablet Take 1,000 mg by mouth every 6 (six) hours as needed for mild pain.    ? albuterol (PROVENTIL) (2.5 MG/3ML) 0.083% nebulizer solution Take 3 mLs (2.5 mg total) by nebulization every 6 (six) hours as needed for wheezing or shortness of breath. 75 mL 1  ? albuterol (VENTOLIN HFA) 108 (90 Base) MCG/ACT inhaler Inhale 2 puffs into the lungs every 6 (six) hours as needed for wheezing or shortness of breath. 8.5 g 1  ?  montelukast (SINGULAIR) 10 MG tablet Take 1 tablet (10 mg total) by mouth at bedtime. 30 tablet 1  ? omeprazole (PRILOSEC) 20 MG capsule Take 1 capsule (20 mg total) by mouth daily. 30 capsule 0  ? fluticasone-salmeterol (WIXELA INHUB) 250-50 MCG/ACT AEPB Inhale 1 puff into the lungs in the morning and at bedtime. 60 each 1  ? magic mouthwash (nystatin, lidocaine, diphenhydrAMINE, alum & mag hydroxide) suspension Swish and spit 5 mLs 4 (four) times daily as needed for mouth pain. 180 mL 0  ? ?No facility-administered medications prior to visit.  ? ? ? ? ? ?Objective:  ? ?Physical Exam: ? ?General appearance: 41 y.o., male, NAD, conversant  ?Eyes: anicteric sclerae; PERRL, tracking appropriately ?HENT: NCAT; MMM ?Neck: Trachea  midline; no lymphadenopathy, no JVD ?Lungs: rhonchi and wheeze bilaterally, with normal respiratory effort ?CV: RRR, no murmur  ?Abdomen: Soft, non-tender; non-distended, BS present  ?Extremities: No peripheral edema, warm ?Skin: Normal turgor and texture; no rash ?Psych: Appropriate affect ?Neuro: Alert and oriented to person and place, no focal deficit  ? ? ? ?Vitals:  ? 09/03/21 1330  ?BP: 134/84  ?Pulse: 96  ?Temp: 98.2 ?F (36.8 ?C)  ?TempSrc: Oral  ?SpO2: 93%  ?Weight: 262 lb 6.4 oz (119 kg)  ?Height: 6\' 2"  (1.88 m)  ? ?93% on \\RA  ?BMI Readings from Last 3 Encounters:  ?09/03/21 33.69 kg/m?  ?08/20/21 36.67 kg/m?  ?07/30/21 37.87 kg/m?  ? ?Wt Readings from Last 3 Encounters:  ?09/03/21 262 lb 6.4 oz (119 kg)  ?08/20/21 270 lb 6 oz (122.6 kg)  ?07/30/21 279 lb 3.2 oz (126.6 kg)  ? ? ? ?CBC ?   ?Component Value Date/Time  ? WBC 14.6 (H) 07/24/2021 0034  ? RBC 6.30 (H) 07/24/2021 0034  ? HGB 18.2 (H) 07/24/2021 0034  ? HCT 52.7 (H) 07/24/2021 0034  ? PLT 244 07/24/2021 0034  ? MCV 83.7 07/24/2021 0034  ? MCH 28.9 07/24/2021 0034  ? MCHC 34.5 07/24/2021 0034  ? RDW 12.8 07/24/2021 0034  ? LYMPHSABS 3.2 07/23/2021 1600  ? MONOABS 1.1 (H) 07/23/2021 1600  ? EOSABS 6.1 (H) 07/23/2021 1600  ? BASOSABS 0.3 (H) 07/23/2021 1600  ? ? ?Has had eosinophilia of [phone]! ? ?Chest Imaging: ?07/24/21 CTA Chest reviewed by me with bronchial wall thickening otherwise unremarkable ? ?Pulmonary Functions Testing Results: ?   ? View : No data to display.  ?  ?  ?  ? ? ? ?Echocardiogram 07/24/21:  ? ?Left ventricular ejection fraction, by estimation, is >75%. The left ventricle has ?hyperdynamic function. The left ventricle has no regional wall motion abnormalities. ?There is moderate left ventricular hypertrophy. Left ventricular diastolic parameters ?are consistent with Grade I diastolic dysfunction (impaired relaxation). ?1. ?Right ventricular systolic function is hyperdynamic. The right ventricular size is ?normal. Tricuspid  regurgitation signal is inadequate for assessing PA pressure. ?2. ?3. The mitral valve is grossly normal. No evidence of mitral valve regurgitation. ?4. The aortic valve is tricuspid. Aortic valve regurgitation is not visualized. ?The inferior vena cava is normal in size with greater than 50% respiratory variability, ?suggesting right atrial pressure of 3 mmHg. ?5. ?Agitated saline contrast bubble study was negative, with no evidence of any interatrial ?shunt. ? ? ?   ?Assessment & Plan:  ? ?# Uncontrolled persistent asthma ?# Eosinophilic asthma ?# Elevated IgE ? 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 ?Plan: ?- even though last CT Chest not clearly suggestive of it, given marked eosinophilia, remarkably elevated IgE  will check aspergillus specific IgE in case he may be developing ABPA ?- if poor control down the road will need to consider addition of biologic ?- PFTs in 3 months on same day as next clinic appointment ?- trial breztri 2 puffs twice daily with spacer, rinse mouth/gargle after each use, instructed re: inhaler technique ?- albuterol prn ?- continue singulair daily for now ? ? ?RTC 3 months  ? ?Maryjane Hurter, MD ?[facility] ?09/03/2021 1:46 PM  ? ?

## 2021-09-03 ENCOUNTER — Ambulatory Visit (INDEPENDENT_AMBULATORY_CARE_PROVIDER_SITE_OTHER): Payer: 59 | Admitting: Student

## 2021-09-03 ENCOUNTER — Encounter: Payer: Self-pay | Admitting: Student

## 2021-09-03 VITALS — BP 134/84 | HR 96 | Temp 98.2°F | Ht 74.0 in | Wt 262.4 lb

## 2021-09-03 DIAGNOSIS — R0609 Other forms of dyspnea: Secondary | ICD-10-CM

## 2021-09-03 DIAGNOSIS — J45998 Other asthma: Secondary | ICD-10-CM

## 2021-09-03 DIAGNOSIS — R0683 Snoring: Secondary | ICD-10-CM | POA: Diagnosis not present

## 2021-09-03 MED ORDER — ALBUTEROL SULFATE HFA 108 (90 BASE) MCG/ACT IN AERS: 2.0000 | INHALATION_SPRAY | Freq: Four times a day (QID) | RESPIRATORY_TRACT | 6 refills | Status: AC | PRN

## 2021-09-03 MED ORDER — BREZTRI AEROSPHERE 160-9-4.8 MCG/ACT IN AERO: 2.0000 | INHALATION_SPRAY | Freq: Two times a day (BID) | RESPIRATORY_TRACT | 11 refills | Status: DC

## 2021-09-03 MED ORDER — BREZTRI AEROSPHERE 160-9-4.8 MCG/ACT IN AERO: 2.0000 | INHALATION_SPRAY | Freq: Two times a day (BID) | RESPIRATORY_TRACT | 0 refills | Status: DC

## 2021-09-03 NOTE — Patient Instructions (Signed)
-   Start breztri 2 puffs twice daily with spacer, rinse mouth after use/gargle, brush teeth ?- Continue singulair 10 mg daily ?- albuterol inhaler as needed ?- lab today ?- you will be called to schedule PFT (breathing tests) in 3 months ?- see you in 3 months or sooner if need be ?

## 2021-09-04 ENCOUNTER — Encounter: Payer: Self-pay | Admitting: Family Medicine

## 2021-09-08 ENCOUNTER — Telehealth: Payer: Self-pay

## 2021-09-08 ENCOUNTER — Other Ambulatory Visit (HOSPITAL_COMMUNITY): Payer: Self-pay

## 2021-09-08 NOTE — Telephone Encounter (Signed)
Patient Advocate Encounter   Received notification that prior authorization for Breztri Aerosphere 160-9-4.8MCG/ACT aerosol is required.   PA submitted on 09/08/2021 Key: [id number]  Status is pending

## 2021-09-09 ENCOUNTER — Telehealth: Payer: Self-pay | Admitting: Student

## 2021-09-09 LAB — ASPERGILLUS IGE PANEL
A. Amstel/Glaucu Class Interp: 0
A. Flavus Class Interp: 1
A. Fumigatus Class Interp: 0
A. Nidulans Class Interp: 0
A. Niger Class Interp: 0
A. Versicolor Class Interp: 0
Aspergillus amstel/glaucu IgE*: <0.35 kU/L (ref <0.35)
Aspergillus flavus IgE: 0.48 kU/L — ABNORMAL HIGH (ref <0.35)
Aspergillus fumigatus IgE: <0.1 kU/L (ref <0.35)
Aspergillus nidulans IgE: <0.35 kU/L (ref <0.35)
Aspergillus niger IgE: <0.1 kU/L (ref <0.35)
Aspergillus versicolor IgE: <0.1 kU/L (ref <0.35)

## 2021-09-09 NOTE — Telephone Encounter (Signed)
Sure ok to return to work without restrictions. 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

## 2021-09-09 NOTE — Telephone Encounter (Signed)
ATC pt, VM box has not been set up yet.   Dr. Verlee Monte, please advise if ok to write letter for pt to return to work without restrictions. Thanks.

## 2021-09-10 NOTE — Telephone Encounter (Signed)
Received notification that the request for prior authorization for Breztri Aerosphere 160-9-4.8MCG/ACT aerosol  has been denied due to patient not trying 3 in a class with 3 or more alternatives, per plan are Bevespi, Trelegy Ellipta, Breo Ellipta, Anoro Ellipta.

## 2021-09-10 NOTE — Telephone Encounter (Signed)
Called patient but he did not answer and his VM is not setup. Will attempt to call back later.

## 2021-09-18 MED ORDER — TRELEGY ELLIPTA 200-62.5-25 MCG/ACT IN AEPB: 1.0000 | INHALATION_SPRAY | Freq: Every day | RESPIRATORY_TRACT | 11 refills | Status: AC

## 2021-09-18 NOTE — Addendum Note (Signed)
Addended by: Maryjane Hurter on: 09/18/2021 08:53 PM   Modules accepted: Orders

## 2021-09-23 ENCOUNTER — Encounter: Payer: Self-pay | Admitting: *Deleted

## 2021-10-15 NOTE — Telephone Encounter (Signed)
Attempted to call pt but unable to reach and unable to leave VM due to mailbox not being set up yet. Due to multiple attempts with trying to reach pt and unable to do so, per protocol encounter will be closed.

## 2021-12-08 NOTE — Progress Notes (Deleted)
Synopsis: Referred for asthma by Sabino Dick, DO  Subjective:   PATIENT ID: Randy Newman. GENDER: male DOB: 01/31/1981, MRN: [id number]  No chief complaint on file.  41yM with history of asthma, vaping, MJ use, suspected sleep-disordered breathing referred for dyspnea, wheeze, covid-19 infection 4 months ago - symptomatically severe but not hospitalized, hadn't been vaccinated for it.   Episodic dyspnea, cough, chest tightness. Still hoarse since hospitalization but doesn't notice accompanying throat tightness during episodes. He doesn't have sinus congestion, postnasal drainage.   He snores, no PND. Some sleepiness during the day but he attributes this to nighttime cough.   No family history of lung disease other than father with COPD 'from working on farm'  Born in [place]. Lived in [place] for a year when he was younger. But has lived all over [place] including in [place], [place], [place], [place]. Smoked 12-13 py, quit 6 weeks ago. MJ in past (joints). He tried a CBD vape pen for a week before his hospitalization and then stopped it.   Interval HPI: Changed to trelegy due to insurance preference after last visit, continued on singulair. PFTs  Otherwise pertinent review of systems is negative.  Past Medical History:  Diagnosis Date   Asthma      No family history on file.   No past surgical history on file.  Social History   Socioeconomic History   Marital status: Single    Spouse name: Not on file   Number of children: Not on file   Years of education: Not on file   Highest education level: Not on file  Occupational History   Not on file  Tobacco Use   Smoking status: Former    Packs/day: 1.50    Years: 15.00    Total pack years: 22.50    Types: Cigarettes    Quit date: 06/18/2021    Years since quitting: 0.4   Smokeless tobacco: Never  Vaping Use   Vaping Use: Former   Quit date: 05/21/2021   Devices: used vape for approx 1 wk  Substance and Sexual Activity    Alcohol use: Not Currently   Drug use: Not Currently   Sexual activity: Yes    Birth control/protection: Condom  Other Topics Concern   Not on file  Social History Narrative   Not on file   Social Determinants of Health   Financial Resource Strain: Not on file  Food Insecurity: Not on file  Transportation Needs: Not on file  Physical Activity: Not on file  Stress: Not on file  Social Connections: Not on file  Intimate Partner Violence: Not on file     No Known Allergies   Outpatient Medications Prior to Visit  Medication Sig Dispense Refill   acetaminophen (TYLENOL) 500 MG tablet Take 1,000 mg by mouth every 6 (six) hours as needed for mild pain.     albuterol (PROVENTIL) (2.5 MG/3ML) 0.083% nebulizer solution Take 3 mLs (2.5 mg total) by nebulization every 6 (six) hours as needed for wheezing or shortness of breath. 75 mL 1   albuterol (VENTOLIN HFA) 108 (90 Base) MCG/ACT inhaler Inhale 2 puffs into the lungs every 6 (six) hours as needed for wheezing or shortness of breath. 8.5 g 6   Budeson-Glycopyrrol-Formoterol (BREZTRI AEROSPHERE) 160-9-4.8 MCG/ACT AERO Inhale 2 puffs into the lungs in the morning and at bedtime. 5.9 g 0   Fluticasone-Umeclidin-Vilant (TRELEGY ELLIPTA) 200-62.5-25 MCG/ACT AEPB Inhale 1 puff into the lungs daily. 1 each 11   montelukast (SINGULAIR)  10 MG tablet Take 1 tablet (10 mg total) by mouth at bedtime. 30 tablet 1   omeprazole (PRILOSEC) 20 MG capsule Take 1 capsule (20 mg total) by mouth daily. 30 capsule 0   No facility-administered medications prior to visit.       Objective:   Physical Exam:  General appearance: 41 y.o., male, NAD, conversant  Eyes: anicteric sclerae; PERRL, tracking appropriately HENT: NCAT; MMM Neck: Trachea midline; no lymphadenopathy, no JVD Lungs: rhonchi and wheeze bilaterally, with normal respiratory effort CV: RRR, no murmur  Abdomen: Soft, non-tender; non-distended, BS present  Extremities: No peripheral  edema, warm Skin: Normal turgor and texture; no rash Psych: Appropriate affect Neuro: Alert and oriented to person and place, no focal deficit     There were no vitals filed for this visit.    on \\RA  BMI Readings from Last 3 Encounters:  09/03/21 33.69 kg/m  08/20/21 36.67 kg/m  07/30/21 37.87 kg/m   Wt Readings from Last 3 Encounters:  09/03/21 262 lb 6.4 oz (119 kg)  08/20/21 270 lb 6 oz (122.6 kg)  07/30/21 279 lb 3.2 oz (126.6 kg)     CBC    Component Value Date/Time   WBC 14.6 (H) 07/24/2021 0034   RBC 6.30 (H) 07/24/2021 0034   HGB 18.2 (H) 07/24/2021 0034   HCT 52.7 (H) 07/24/2021 0034   PLT 244 07/24/2021 0034   MCV 83.7 07/24/2021 0034   MCH 28.9 07/24/2021 0034   MCHC 34.5 07/24/2021 0034   RDW 12.8 07/24/2021 0034   LYMPHSABS 3.2 07/23/2021 1600   MONOABS 1.1 (H) 07/23/2021 1600   EOSABS 6.1 (H) 07/23/2021 1600   BASOSABS 0.3 (H) 07/23/2021 1600    Has had eosinophilia of [phone]!  Chest Imaging: 07/24/21 CTA Chest reviewed by me with bronchial wall thickening otherwise unremarkable  Pulmonary Functions Testing Results:     No data to display           Echocardiogram 07/24/21:   Left ventricular ejection fraction, by estimation, is >75%. The left ventricle has hyperdynamic function. The left ventricle has no regional wall motion abnormalities. There is moderate left ventricular hypertrophy. Left ventricular diastolic parameters are consistent with Grade I diastolic dysfunction (impaired relaxation). 1. Right ventricular systolic function is hyperdynamic. The right ventricular size is normal. Tricuspid regurgitation signal is inadequate for assessing PA pressure. 2. 3. The mitral valve is grossly normal. No evidence of mitral valve regurgitation. 4. The aortic valve is tricuspid. Aortic valve regurgitation is not visualized. The inferior vena cava is normal in size with greater than 50% respiratory variability, suggesting right atrial  pressure of 3 mmHg. 5. Agitated saline contrast bubble study was negative, with no evidence of any interatrial shunt.      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

## 2021-12-09 ENCOUNTER — Ambulatory Visit: Payer: 59 | Admitting: Student

## 2022-02-20 ENCOUNTER — Encounter: Payer: Self-pay | Admitting: Family Medicine

## 2022-02-20 ENCOUNTER — Other Ambulatory Visit: Payer: Self-pay | Admitting: Student

## 2022-02-23 ENCOUNTER — Other Ambulatory Visit: Payer: Self-pay | Admitting: Family Medicine

## 2022-02-23 MED ORDER — BREZTRI AEROSPHERE 160-9-4.8 MCG/ACT IN AERO: 2.0000 | INHALATION_SPRAY | Freq: Two times a day (BID) | RESPIRATORY_TRACT | 0 refills | Status: AC

## 2022-02-23 MED ORDER — ALBUTEROL SULFATE (2.5 MG/3ML) 0.083% IN NEBU: 2.5000 mg | INHALATION_SOLUTION | Freq: Four times a day (QID) | RESPIRATORY_TRACT | 1 refills | Status: AC | PRN

## 2022-02-23 NOTE — Telephone Encounter (Signed)
Refilled

## 2023-03-14 IMAGING — CT CT ANGIO CHEST
2 of 6 series · 17 of 46 positions shown · IV contrast (agent unspecified)
Comparison: 07/19/2021

CLINICAL DATA: Chest pain or shortness of breath with pleurisy or
effusion suspected. Shortness of breath for 1 week. Dry cough.

EXAM:
CT ANGIOGRAPHY CHEST WITH CONTRAST
TECHNIQUE: Multidetector CT imaging of the chest was performed using the
standard protocol during bolus administration of intravenous
contrast. Multiplanar CT image reconstructions and MIPs were
obtained to evaluate the vascular anatomy.

[Series 6: thins · axial · 0.89mm/px · z∈[+1267,+1525]mm · 14 of 284 slices shown]
[im 13/284  lung]
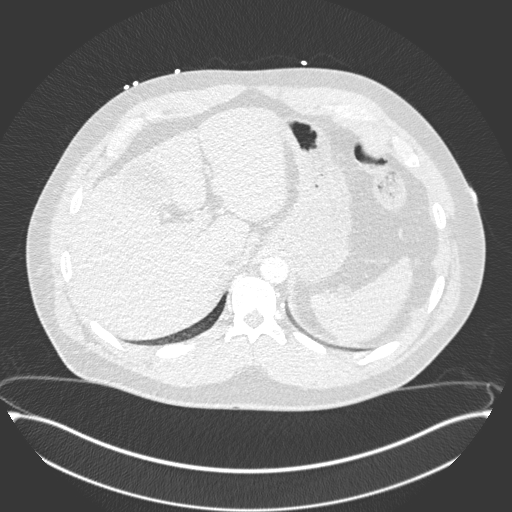
[im 37/284  soft-tissue]
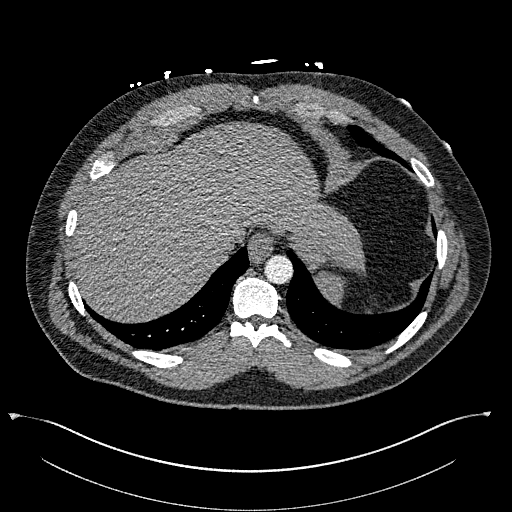
[im 50/284  lung]
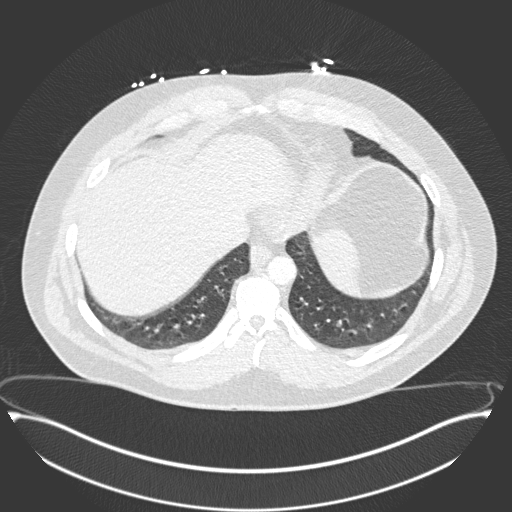
[im 74/284  soft-tissue]
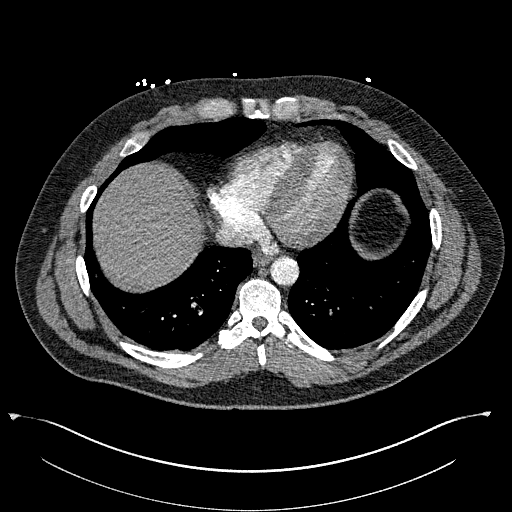
[im 99/284  lung]
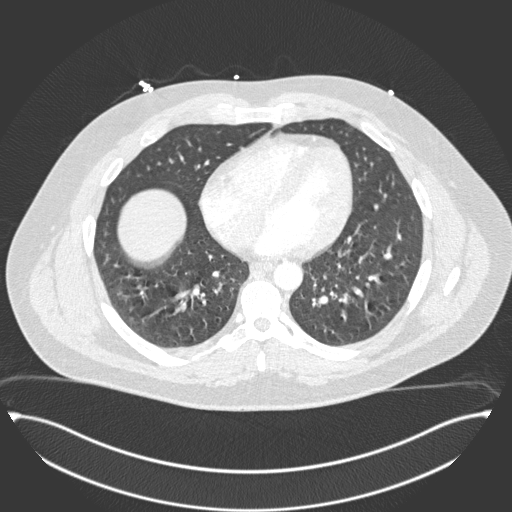
[im 111/284  soft-tissue]
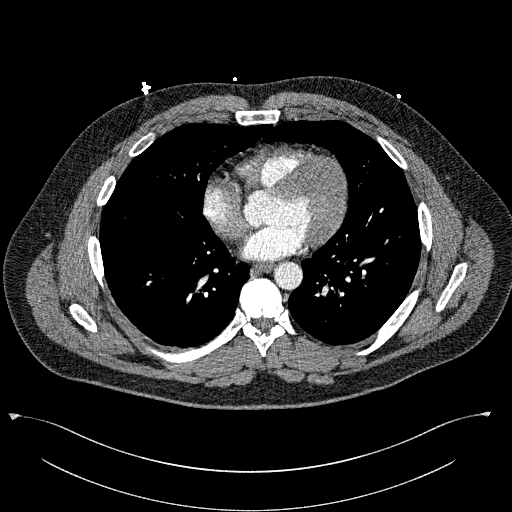
[im 136/284  lung]
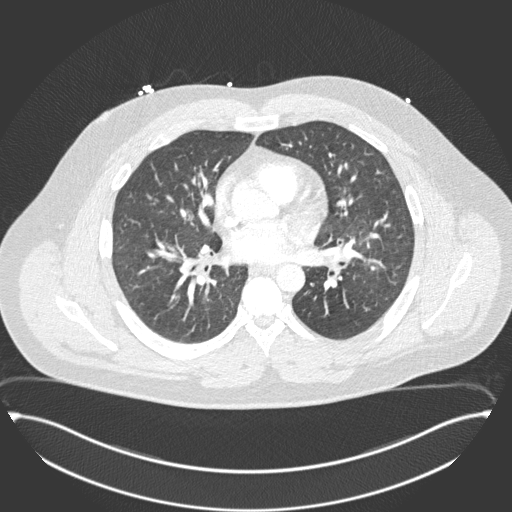
[im 148/284  soft-tissue]
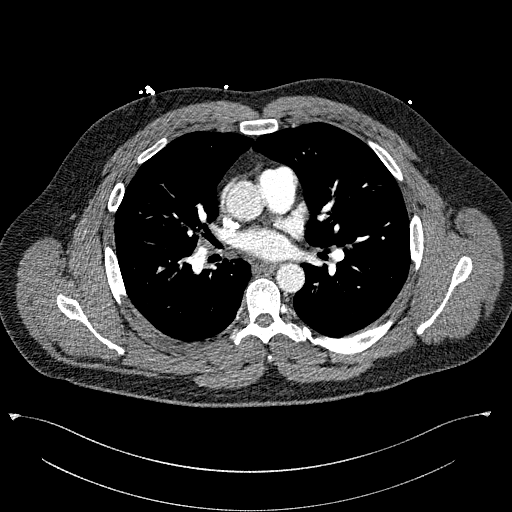
[im 173/284  lung]
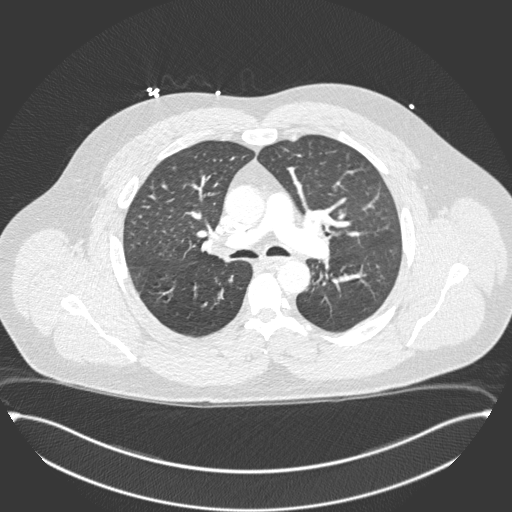
[im 185/284  soft-tissue]
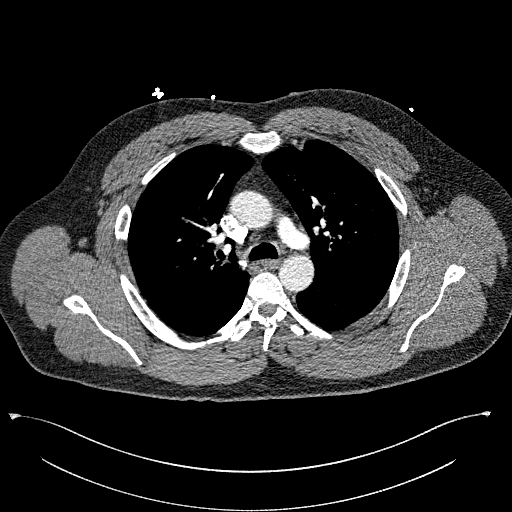
[im 210/284  lung]
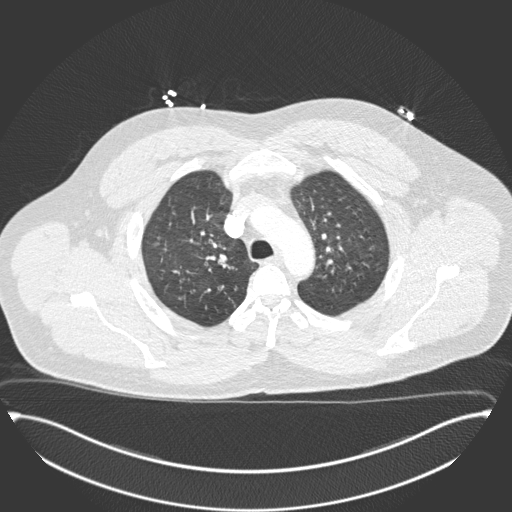
[im 234/284  soft-tissue]
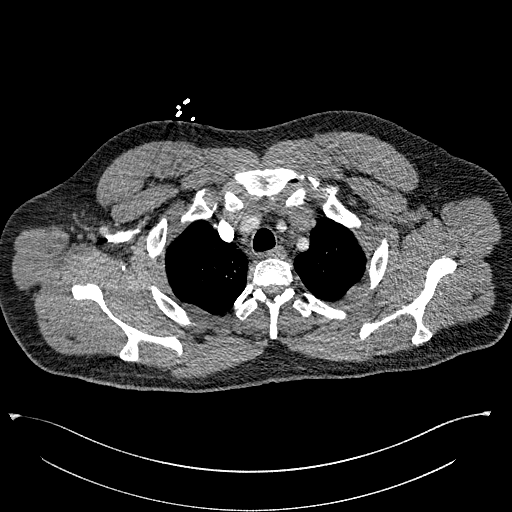
[im 247/284  lung]
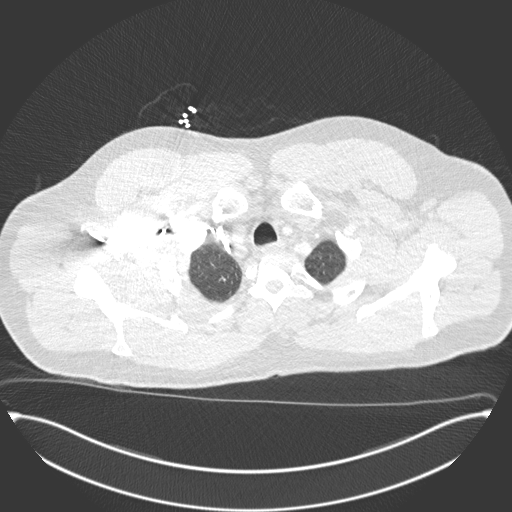
[im 271/284  soft-tissue]
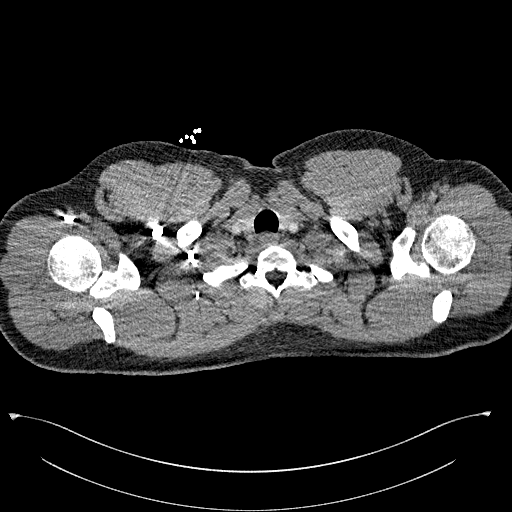

[Series 8: coronal mpr · coronal · 0.58mm/px · 3 of 151 slices shown]
[im 38/151  soft-tissue]
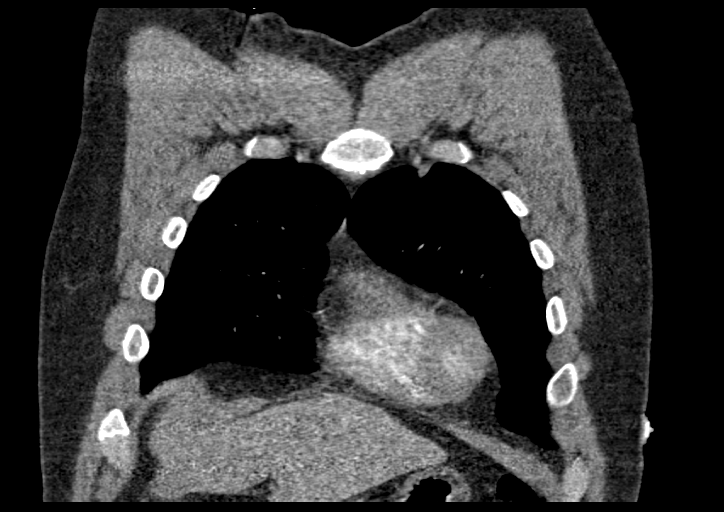
[im 76/151  soft-tissue]
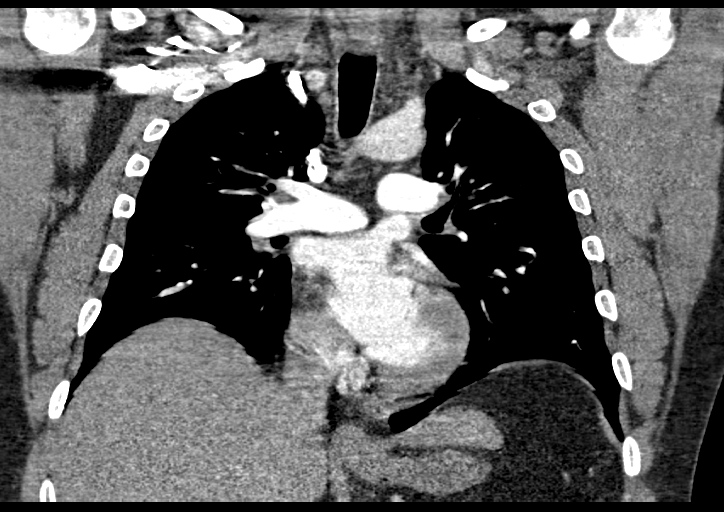
[im 113/151  soft-tissue]
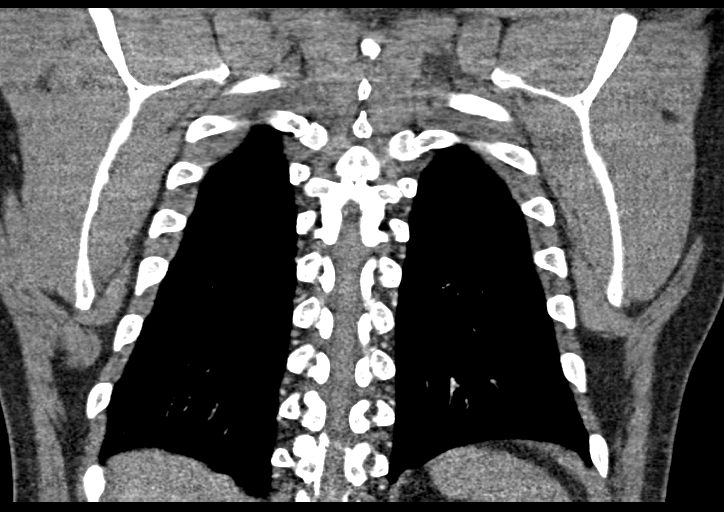

[17 of 46 positions shown; findings below may reference images not displayed]

RADIATION DOSE REDUCTION: This exam was performed according to the
departmental dose-optimization program which includes automated
exposure control, adjustment of the mA and/or kV according to
patient size and/or use of iterative reconstruction technique.

CONTRAST:  62mL OMNIPAQUE IOHEXOL 350 MG/ML SOLN
FINDINGS: Cardiovascular: Normal heart size. No pericardial effusions. Normal
caliber thoracic aorta. Great vessel origins are patent. Contrast
bolus is somewhat limited, precluding evaluation of the distal
segmental and subsegmental pulmonary arteries. The central pulmonary
arteries are opacified without evidence of large central pulmonary
embolus.

Mediastinum/Nodes: No enlarged mediastinal, hilar, or axillary lymph
nodes. Thyroid gland, trachea, and esophagus demonstrate no
significant findings.

Lungs/Pleura: Motion artifact limits examination. Peribronchial
thickening suggesting airways disease. No consolidation or edema. No
pleural effusions.

Upper Abdomen: Cholelithiasis versus gallbladder sludge.

Musculoskeletal: No chest wall abnormality. No acute or significant
osseous findings.

Review of the MIP images confirms the above findings.
IMPRESSION: 1. No evidence of central pulmonary embolus. More distal vessels are
not well opacified and are unable to be evaluated.
2. Peribronchial thickening suggesting airways disease. No focal
consolidation.
3. Cholelithiasis or gallbladder sludge.

## 2023-03-15 IMAGING — DX DG CHEST 1V PORT
1 series · 1 of 1 positions shown · non-contrast
Comparison: Chest radiograph 07/23/2021

CLINICAL DATA: Respiratory distress, shortness of breath

EXAM:
PORTABLE CHEST 1 VIEW

[chest]
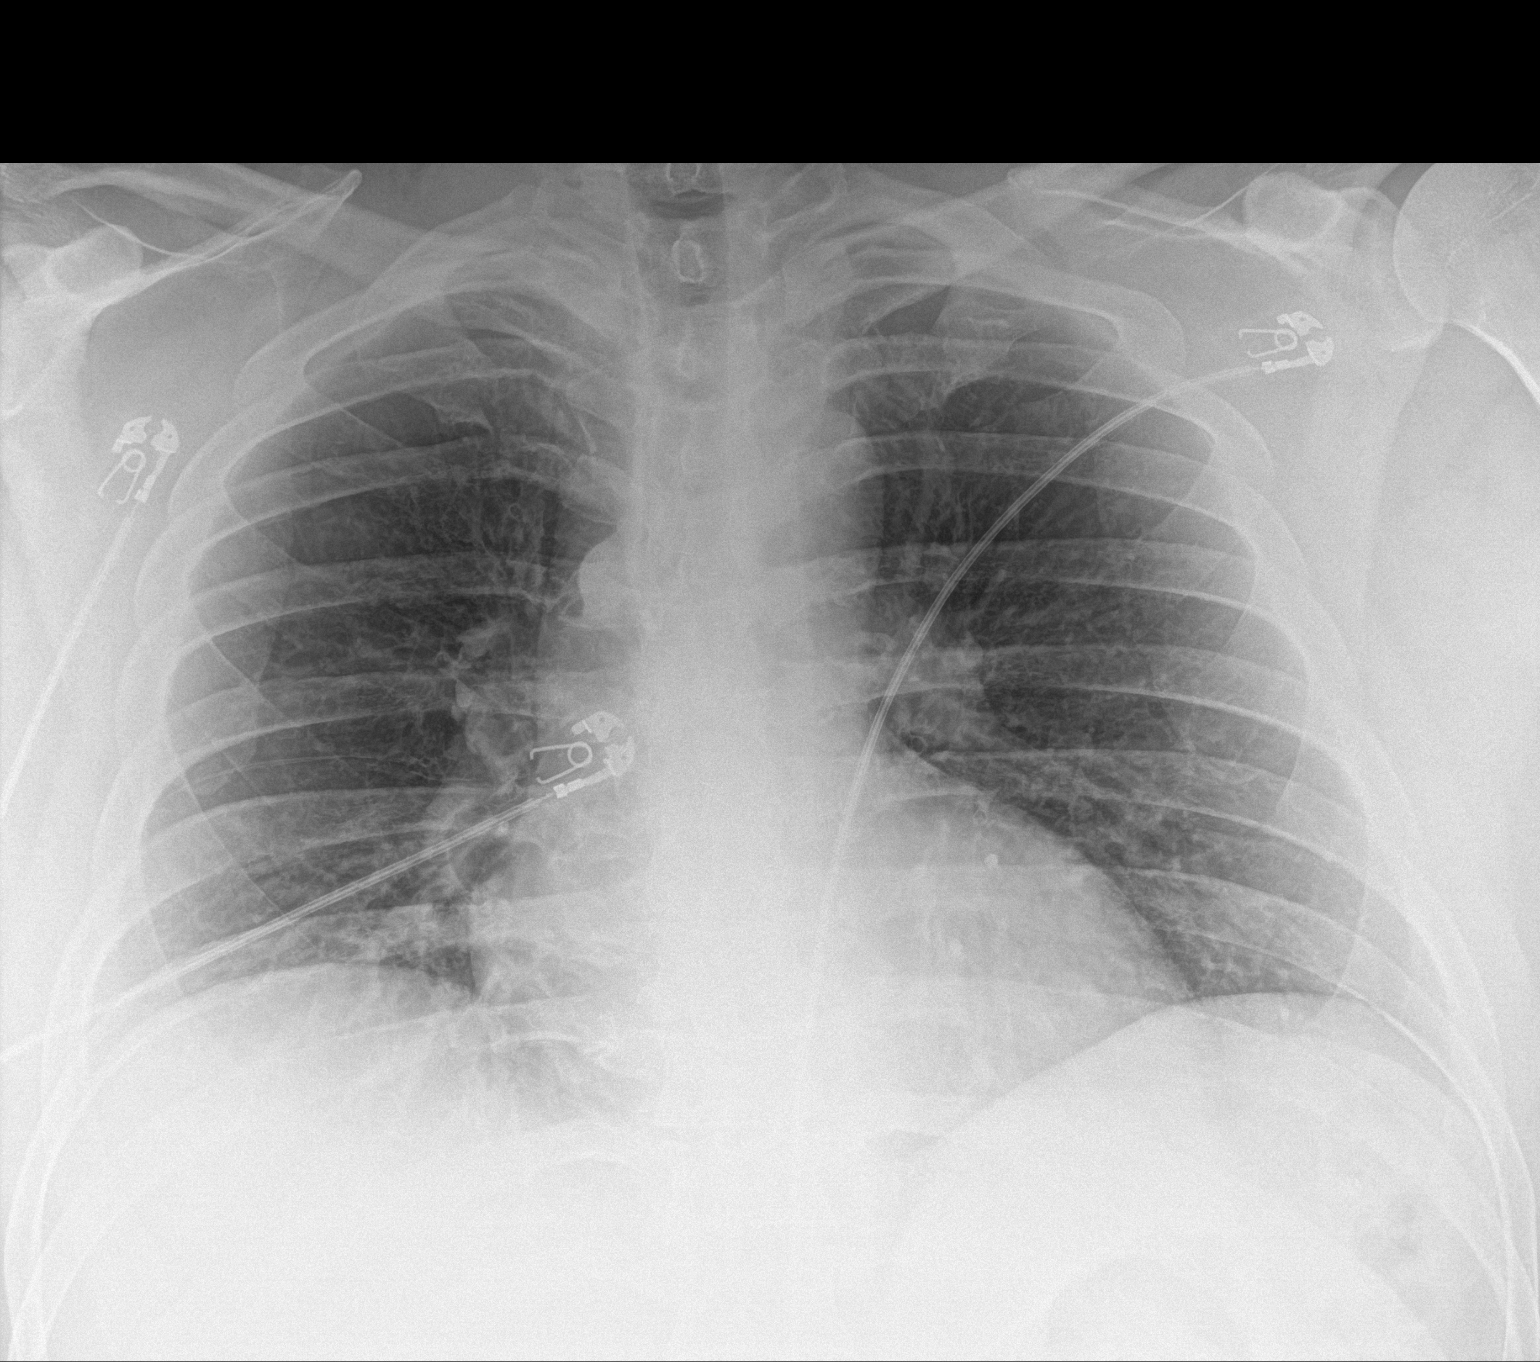

[1 of 1 positions shown; findings below may reference images not displayed]

FINDINGS: The cardiomediastinal silhouette is stable.

Lung volumes are low. There is no focal consolidation or pulmonary
edema. There is no pleural effusion or pneumothorax

There is no acute osseous abnormality.
IMPRESSION: Low lung volumes. Otherwise, no radiographic evidence of acute
cardiopulmonary process.
